# Patient Record
Sex: Male | Born: 1935 | Race: White | Hispanic: No | Marital: Married | Smoking: Former smoker
Health system: Southern US, Community
[De-identification: ages and names within clinical notes are randomized; demographics above are authoritative.]

## PROBLEM LIST (undated history)

## (undated) DIAGNOSIS — E785 Hyperlipidemia, unspecified: Secondary | ICD-10-CM

## (undated) DIAGNOSIS — H269 Unspecified cataract: Secondary | ICD-10-CM

## (undated) DIAGNOSIS — I739 Peripheral vascular disease, unspecified: Secondary | ICD-10-CM

## (undated) HISTORY — DX: Unspecified cataract: H26.9

## (undated) HISTORY — DX: Hyperlipidemia, unspecified: E78.5

## (undated) HISTORY — DX: Peripheral vascular disease, unspecified: I73.9

---

## 2007-02-02 ENCOUNTER — Ambulatory Visit: Payer: Self-pay | Admitting: Ophthalmology

## 2007-03-02 ENCOUNTER — Ambulatory Visit: Payer: Self-pay | Admitting: Ophthalmology

## 2008-02-06 ENCOUNTER — Emergency Department (HOSPITAL_COMMUNITY): Admission: EM | Admit: 2008-02-06 | Discharge: 2008-02-07 | Payer: Self-pay | Admitting: Emergency Medicine

## 2010-03-29 ENCOUNTER — Encounter: Admission: RE | Admit: 2010-03-29 | Discharge: 2010-03-29 | Payer: Self-pay | Admitting: Cardiovascular Disease

## 2010-04-26 ENCOUNTER — Ambulatory Visit (HOSPITAL_COMMUNITY): Admission: RE | Admit: 2010-04-26 | Discharge: 2010-04-26 | Payer: Self-pay | Admitting: Cardiovascular Disease

## 2010-06-11 ENCOUNTER — Encounter
Admission: RE | Admit: 2010-06-11 | Discharge: 2010-06-11 | Payer: Self-pay | Source: Home / Self Care | Admitting: Diagnostic Neuroimaging

## 2010-06-18 ENCOUNTER — Ambulatory Visit: Payer: Self-pay | Admitting: Surgery

## 2010-07-16 ENCOUNTER — Ambulatory Visit: Payer: Self-pay | Admitting: Surgery

## 2010-07-26 ENCOUNTER — Ambulatory Visit: Payer: Self-pay

## 2010-08-21 ENCOUNTER — Ambulatory Visit: Payer: Self-pay | Admitting: Oncology

## 2010-09-05 ENCOUNTER — Ambulatory Visit (HOSPITAL_COMMUNITY)
Admission: RE | Admit: 2010-09-05 | Discharge: 2010-09-05 | Payer: Self-pay | Source: Home / Self Care | Attending: Oncology | Admitting: Oncology

## 2010-09-05 LAB — CBC WITH DIFFERENTIAL/PLATELET
BASO%: 1 % (ref 0.0–2.0)
Basophils Absolute: 0 10*3/uL (ref 0.0–0.1)
EOS%: 1.9 % (ref 0.0–7.0)
Eosinophils Absolute: 0.1 10*3/uL (ref 0.0–0.5)
HCT: 45.8 % (ref 38.4–49.9)
HGB: 15.7 g/dL (ref 13.0–17.1)
LYMPH%: 31.5 % (ref 14.0–49.0)
MCH: 30.5 pg (ref 27.2–33.4)
MCHC: 34.3 g/dL (ref 32.0–36.0)
MCV: 89.1 fL (ref 79.3–98.0)
MONO#: 0.3 10*3/uL (ref 0.1–0.9)
MONO%: 6.7 % (ref 0.0–14.0)
NEUT#: 3 10*3/uL (ref 1.5–6.5)
NEUT%: 58.9 % (ref 39.0–75.0)
Platelets: 153 10*3/uL (ref 140–400)
RBC: 5.14 10*6/uL (ref 4.20–5.82)
RDW: 13.1 % (ref 11.0–14.6)
WBC: 5 10*3/uL (ref 4.0–10.3)
lymph#: 1.6 10*3/uL (ref 0.9–3.3)

## 2010-09-10 LAB — COMPREHENSIVE METABOLIC PANEL
ALT: 20 U/L (ref 0–53)
AST: 24 U/L (ref 0–37)
Albumin: 4.2 g/dL (ref 3.5–5.2)
Alkaline Phosphatase: 58 U/L (ref 39–117)
BUN: 18 mg/dL (ref 6–23)
CO2: 26 mEq/L (ref 19–32)
Calcium: 9.4 mg/dL (ref 8.4–10.5)
Chloride: 105 mEq/L (ref 96–112)
Creatinine, Ser: 1.05 mg/dL (ref 0.40–1.50)
Glucose, Bld: 115 mg/dL — ABNORMAL HIGH (ref 70–99)
Potassium: 4 mEq/L (ref 3.5–5.3)
Sodium: 141 mEq/L (ref 135–145)
Total Bilirubin: 0.7 mg/dL (ref 0.3–1.2)
Total Protein: 6.5 g/dL (ref 6.0–8.3)

## 2010-09-10 LAB — IMMUNOFIXATION ELECTROPHORESIS: IgG (Immunoglobin G), Serum: 959 mg/dL (ref 694–1618)

## 2010-09-10 LAB — KAPPA/LAMBDA LIGHT CHAINS
Kappa free light chain: 1.02 mg/dL (ref 0.33–1.94)
Kappa:Lambda Ratio: 0.74 (ref 0.26–1.65)
Lambda Free Lght Chn: 1.38 mg/dL (ref 0.57–2.63)

## 2010-10-09 ENCOUNTER — Encounter (HOSPITAL_BASED_OUTPATIENT_CLINIC_OR_DEPARTMENT_OTHER): Payer: Medicare Other | Admitting: Oncology

## 2010-10-09 DIAGNOSIS — D472 Monoclonal gammopathy: Secondary | ICD-10-CM

## 2010-12-25 ENCOUNTER — Ambulatory Visit: Payer: Self-pay

## 2011-01-01 NOTE — Assessment & Plan Note (Signed)
OFFICE VISIT   CONSTANTINE, RUDDICK  DOB:  Jun 27, 1933                                       06/18/2010  ZOXWR#:[id number]   REFERRING PHYSICIAN:  Dr. Nanetta Batty   REASON FOR VISIT:  Left leg claudication.   HISTORY:  This is a 77 year old gentleman seen at request of Dr. Allyson Sabal  for evaluation and surgical management of left leg claudication.  For  the past several years, the patient has had problems with his left leg.  He states his leg begins to ache at approximately 100 feet which causes  him to have to rest which makes his pain and symptoms go away.  This  does affect his ability to get out and do his daily activities.  He  denies ulceration or rest pain.  He also complains of pain in his right  chest and has undergone a full cardiac workup which found no evidence of  myocardial ischemia.  He has also a CT scan of his chest which was  negative.  However, his right chest pain seems to be his biggest  complaint and keeps him from sleeping at night.  He has to roll a pillow  up under his arm and take an Aleve to get rest.   The patient has undergone Doppler examination which shows an ABI of 0.66  on the left.  Angiogram confirmed an occluded left iliac.  He is here to  discuss surgical revascularization.   The patient has a history of tobacco abuse.  However, quit in October  2007.  He is also medically managed for hypercholesterolemia.  He denies  cardiac disease or diabetes or hypertension.   REVIEW OF SYSTEMS:  GENERAL:  Negative for fevers, chills, weight gain,  weight loss.  VASCULAR:  Positive for pain in his legs after approximately 5 minutes.  CARDIAC:  Positive.  He has right-sided chest pain and shortness of  breath with exertion.  HEMATOLOGY:  He bruises easily.  HEENT:  He has had cataract removal.  All other review of systems are negative as documented in the encounter  form.   PAST MEDICAL HISTORY:  Hypercholesterolemia, cataracts, and  tobacco  abuse.   PAST SURGICAL HISTORY:  None.   FAMILY HISTORY:  Positive for coronary artery disease in his mother who  died at age 47 of congestive failure.  His sister has had a pacemaker  placed for an arrhythmia.  His brother has had coronary stents placed.   SOCIAL HISTORY:  He is married with 2 children.  He is retired.  Does  not drink or smoke currently.  Quit smoking in 2007.   MEDICATIONS:  1. Pravastatin 40 mg per day.  2. Aspirin 81 mg per day.   ALLERGIES:  None.   EXAMINATION:  Vital signs:  Heart rate 70, blood pressure 120/71,  respiratory rate 12.  General:  Well-appearing, no distress.  HEENT:  Within normal limits.  Lungs:  Clear bilaterally.  No wheezes or  rhonchi.  Cardiovascular:  Regular rate.  No carotid bruits.  He has  palpable right femoral pulse left femoral pulse is not palpable.  Abdomen is soft, nontender.  He has a small umbilical hernia.  No  pulsatile mass.  Musculoskeletal:  No major deformity.  I do not  appreciate any musculoskeletal abnormalities in the right pectoral and  axillary region  where he complains of pain.  Neuro:  He has no focal  deficits.  Skin:  Without rash.   DIAGNOSTIC STUDIES:  The patient comes with ABIs from Dr. Hazle Coca  office.  I have reviewed these.  He has an ABI of 1.0 on the right and  0.66 on the left.  I have also reviewed his arteriogram which reveals an  occluded long segment left iliac occlusion with femoral reconstitution  on the left.  No appreciable right-sided iliac disease.   ASSESSMENT/PLAN:  Left leg claudication.   PLAN:  I had an extensive discussion with family, approximately 45  minutes.  We discussed surgical options including aortobifemoral bypass  as well as a right-to-left femoral-femoral bypass graft at this time.  We also discussed that this problem is not a limb-threatening situation  for him.  This would be an elective operation to improve his symptoms of  achiness in his left leg.   At this point, he is unable to convince me  that his left leg symptoms are lifestyle-limiting.  His biggest problem  has been his pain in his right chest.  The patient was unable to make a  decision as to whether or not to proceed with an operation at this time.  After a long discussion with his family, we have decided to start him on  cilostazol to see if this has any improvement in his symptoms.  I will  see him back in a month to see how he is doing and at that time we will  make decisions whether or not to proceed with surgical  revascularization.  Please copy this note to Dr. Nanetta Batty and Dr.  Loma Sender.     Jorge Ny, MD  Electronically Signed   VWB/MEDQ  D:  06/18/2010  T:  06/19/2010  Job:  [id number]

## 2011-01-01 NOTE — Assessment & Plan Note (Signed)
OFFICE VISIT   Brizzi, Keena L  DOB:  October 31, 1932                                       07/16/2010  ZOXWR#:[id number]   REASON FOR VISIT:  Followup claudication.   HISTORY:  This is a 77 year old gentleman who I initially saw at the  request of Dr. Allyson Sabal for opinions regarding surgical management of the  patient's left leg claudication.  The patient begins to have achy  symptoms at approximately 100 feet in his left leg which cause him to  rest, which alleviates his symptoms.  This is becoming lifestyle  altering for him.  He denies ulceration or rest pain.  He has an  arteriogram which shows an occluded left iliac system as well as an  irregularity in his infrarenal abdominal aorta.  When I last saw him his  biggest complaint was that of right chest pain.  A cardiac workup for  this was negative.  Since that was his biggest complaint at that time I  elected to treat him medically with cilostazol.  He comes back in today  for followup.  He states he has had minimal if no relief from his  cilostazol.  He is complaining of some balance irregularity.  He is  seeing a neurologist for this with concerns over inner ear problems.   PHYSICAL EXAMINATION:  Vital signs:  Heart rate 81, blood pressure  139/71, O2 sat 99.  General:  He is well-appearing in no distress.  Respiratory:  Nonlabored.  Extremities:  Are cool bilaterally.  There  are no ulcerations.  There is no edema.  There are no rashes.   ASSESSMENT AND PLAN:  I spent approximately 40 minutes re-reviewing the  patient's arteriogram as well as frank discussion with the patient and  his wife.  At this point we have come to the conclusion that there are  other more pressing issues for him than his left leg vascular  insufficiency.  Specifically he wants to further address the neuropathy  he is having in the legs as well as continue evaluating his right-sided  chest pain which has not been relieved yet.  We  discussed the need for  further evaluation should he develop progressive claudication or  nonhealing wounds.  He is going to contact me for his next appointment  when he is ready to undergo surgical revascularization.     Jorge Ny, MD  Electronically Signed   VWB/MEDQ  D:  07/16/2010  T:  07/16/2010  Job:  [id number]   cc:   Nanetta Batty, M.D.  Loma Sender

## 2011-01-04 NOTE — Procedures (Signed)
NAMEGIOVAN, PINSKY NO.:  [id number]   MEDICAL RECORD NO.:  [id number]          PATIENT TYPE:  AMB   LOCATION:  SDS                          FACILITY:  [facility]   PHYSICIAN:  Nanetta Batty, M.D.   DATE OF BIRTH:  [birth year]   DATE OF PROCEDURE:  DATE OF DISCHARGE:  04/26/2010                    PERIPHERAL VASCULAR INVASIVE PROCEDURE   Mr. Kolbe is a 77 year old married Caucasian male.  Patient of Dr.  Erin Hearing with history of dyslipidemia, tobacco abuse, and  claudication.  He did have chest pain, but declined stress testing.  He  had Dopplers that included left iliac with a left ABI of 0.66.  He  presents now for angiography and potential percutaneous  revascularization for left common and iliac claudication.   PROCEDURE DESCRIPTION:  The patient was brought to the second floor  [facility] angiographic suite in the postabsorptive state.  He has  been premedicated with p.o. Valium.  His right groin was prepped and  shaved in the usual sterile fashion.  Xylocaine 1%  was used for local  anesthesia.  A 5-French sheath was inserted into the right femoral  artery using standard Seldinger technique.  A 5-French pigtail catheter  was used for abdominal aortography with bifemoral runoff using bolus  chase digital subtraction step-table technique.  Visipaque dye was used  for the entirety of the case.  Retrograde aortic pressures were  monitored throughout the case.   ANGIOGRAPHIC RESULTS:  1. Abdominal aorta.      a.     Renal arteries - normal.      b.     Infrarenal abdominal aorta; 40% eccentric distal abdominal       aortic narrowing.  2. Left lower extremity;      a.     Left common and external iliac artery with reconstitution of       the common femoral just above the femoral head.      b.     Three-vessel runoff.  3. Right lower extremity;      a.     Normal arterial anatomy with minor irregularities in the SFA       and two-vessel runoff.   IMPRESSION:  Mr. Hayashida has an occluded left common and external iliac  artery.  I believe this would be difficult to percutaneously  revascularize and I suspect that the best option would be elective fem-  fem crossover grafting.  He will need Persantine Myoview to risk  stratify him prior to the surgery.   Sheath was removed and pressure was held in the groin to achieve  hemostasis.  The patient left the lab in stable condition.  He will be  hydrated, remain recumbent for 5 hours and discharged home.   I will see him back in 2-3 weeks for followup.      Nanetta Batty, M.D.     Cordelia Pen  D:  04/26/2010  T:  04/27/2010  Job:  [id number]   cc:   Thurmon Fair, MD  [facility].  Second Floor [facility] Angiographic   Electronically Signed  by Nanetta Batty M.D. on 05/07/2010 04:31:13 PM

## 2011-01-11 ENCOUNTER — Encounter (HOSPITAL_COMMUNITY)
Admission: RE | Admit: 2011-01-11 | Discharge: 2011-01-11 | Disposition: A | Discharge status: Home / Self Care | Payer: Medicare Other | Source: Ambulatory Visit | Attending: Surgery | Admitting: Surgery

## 2011-01-11 ENCOUNTER — Other Ambulatory Visit (INDEPENDENT_AMBULATORY_CARE_PROVIDER_SITE_OTHER): Payer: Self-pay | Admitting: Surgery

## 2011-01-11 ENCOUNTER — Ambulatory Visit (HOSPITAL_COMMUNITY)
Admission: RE | Admit: 2011-01-11 | Discharge: 2011-01-11 | Disposition: A | Discharge status: Home / Self Care | Payer: Medicare Other | Source: Ambulatory Visit | Attending: Surgery | Admitting: Surgery

## 2011-01-11 DIAGNOSIS — K4090 Unilateral inguinal hernia, without obstruction or gangrene, not specified as recurrent: Secondary | ICD-10-CM

## 2011-01-11 DIAGNOSIS — Z01818 Encounter for other preprocedural examination: Secondary | ICD-10-CM | POA: Insufficient documentation

## 2011-01-11 DIAGNOSIS — Z01812 Encounter for preprocedural laboratory examination: Secondary | ICD-10-CM | POA: Insufficient documentation

## 2011-01-11 DIAGNOSIS — Z01810 Encounter for preprocedural cardiovascular examination: Secondary | ICD-10-CM | POA: Insufficient documentation

## 2011-01-11 LAB — COMPREHENSIVE METABOLIC PANEL
ALT: 25 U/L (ref 0–53)
AST: 27 U/L (ref 0–37)
Albumin: 3.4 g/dL — ABNORMAL LOW (ref 3.5–5.2)
Alkaline Phosphatase: 61 U/L (ref 39–117)
BUN: 10 mg/dL (ref 6–23)
Chloride: 109 mEq/L (ref 96–112)
GFR calc Af Amer: >60 mL/min (ref >60)
Potassium: 4 mEq/L (ref 3.5–5.1)
Sodium: 143 mEq/L (ref 135–145)
Total Bilirubin: 0.4 mg/dL (ref 0.3–1.2)

## 2011-01-11 LAB — DIFFERENTIAL
Eosinophils Absolute: 0.2 10*3/uL (ref 0.0–0.7)
Lymphs Abs: 1.6 10*3/uL (ref 0.7–4.0)
Neutrophils Relative %: 54 % (ref 43–77)

## 2011-01-11 LAB — CBC
MCV: 88.6 fL (ref 78.0–100.0)
Platelets: 117 10*3/uL — ABNORMAL LOW (ref 150–400)
RBC: 4.99 MIL/uL (ref 4.22–5.81)
WBC: 5.1 10*3/uL (ref 4.0–10.5)

## 2011-01-15 ENCOUNTER — Ambulatory Visit (HOSPITAL_COMMUNITY): Admission: RE | Admit: 2011-01-15 | Payer: Medicare Other | Source: Ambulatory Visit | Admitting: Surgery

## 2011-01-18 HISTORY — PX: INGUINAL HERNIA REPAIR: SUR1180

## 2011-02-04 ENCOUNTER — Ambulatory Visit (INDEPENDENT_AMBULATORY_CARE_PROVIDER_SITE_OTHER): Payer: Medicare Other | Admitting: Surgery

## 2011-02-04 DIAGNOSIS — I70219 Atherosclerosis of native arteries of extremities with intermittent claudication, unspecified extremity: Secondary | ICD-10-CM

## 2011-02-04 NOTE — Assessment & Plan Note (Signed)
OFFICE VISIT  SHAMIR, TUZZOLINO DOB:  04/30/1933                                       02/04/2011 UEAVW#:[id number]  The patient comes back in today for further discussions regarding his left leg claudication.  We have been trying to manage him medically, as his symptoms do not appear to be that lifestyle altering.  However, he has failed trying cilostazol and other medications and is really limited now by his leg pain.  He wants to discuss further options.  He had angiogram by Dr. Allyson Sabal.  Left iliac occlusion was encountered.  There was no significant right iliac stenosis.  He had an irregular appearing aorta without aneurysmal change or stenosis.  Hypercholesterolemia, tobacco abuse and cataracts.  REVIEW OF SYSTEMS:  Negative except for what is in the HPI.  He does complain of some generalized weakness.  SOCIAL HISTORY:  He is married with 2 children.  Retired.  Does not drink or smoke currently.  Quit smoking in 2007.  FAMILY HISTORY:  Positive for coronary artery disease in his mother, died at age 49, congestive heart failure.  Sister had pacemaker for arrhythmia.  His brother had coronary stents placed.  MEDICATIONS:  Include pravastatin and aspirin.  ALLERGIES:  None.  PHYSICAL EXAMINATION:  Vital signs:  Heart rate 70, blood pressure 112/67, O2 sat 97, respiratory rate 20.  General:  He is well-appearing, in no distress.  HEENT:  Within normal limits.  Lungs:  Clear bilaterally.  Cardiovascular:  Regular rhythm, no murmur.  No carotid bruits.  Palpable right femoral pulse.  No left femoral pulse.  Abdomen: Soft and nontender.  He has a right inguinal hernia.  Musculoskeletal: No major deformities.  Neurologic:  Intact.  Skin:  Without rash.  ASSESSMENT:  Left leg claudication.  PLAN:  We have discussed proceeding with aortobifemoral versus femoral- femoral bypass graft.  We discussed the pros and cons of each.  The patient wishes to proceed  with the lesser invasive of the two which would be a right to left femoral-femoral bypass graft.  He understands that the longevity of this bypass is not as durable but he prefers the less invasive nature of it.  He also has a right inguinal hernia.  He is seeing Dr. Marca Ancona for this.  He would like to try to get these done during the same anesthetic if possible.  I have described the details of the procedure as well as the risks and benefits including but not limited to cardiopulmonary complications, infection and graft failure. He understands this and wishes to proceed as soon as possible.  I will try to contact Dr. Rosezena Sensor office to get this scheduled.  I proposed 3 dates, Friday June 20, Thursday or Friday July 12 or July 13.    Jorge Ny, MD Electronically Signed  VWB/MEDQ  D:  02/04/2011  T:  02/04/2011  Job:  [id number]  cc:   Thomas A. Cornett, M.D. Nanetta Batty, M.D. Loma Sender, MD

## 2011-02-12 ENCOUNTER — Other Ambulatory Visit: Payer: Self-pay | Admitting: Diagnostic Neuroimaging

## 2011-02-12 DIAGNOSIS — R269 Unspecified abnormalities of gait and mobility: Secondary | ICD-10-CM

## 2011-02-12 DIAGNOSIS — G629 Polyneuropathy, unspecified: Secondary | ICD-10-CM

## 2011-02-13 ENCOUNTER — Other Ambulatory Visit (HOSPITAL_COMMUNITY)
Admission: RE | Admit: 2011-02-13 | Discharge: 2011-02-13 | Disposition: A | Discharge status: Home / Self Care | Payer: Medicare Other | Source: Ambulatory Visit | Attending: Radiology | Admitting: Radiology

## 2011-02-13 ENCOUNTER — Ambulatory Visit
Admission: RE | Admit: 2011-02-13 | Discharge: 2011-02-13 | Disposition: A | Discharge status: Home / Self Care | Payer: Medicare Other | Source: Ambulatory Visit | Attending: Diagnostic Neuroimaging | Admitting: Diagnostic Neuroimaging

## 2011-02-13 ENCOUNTER — Other Ambulatory Visit: Payer: Self-pay | Admitting: Radiology

## 2011-02-13 DIAGNOSIS — G629 Polyneuropathy, unspecified: Secondary | ICD-10-CM

## 2011-02-13 DIAGNOSIS — G589 Mononeuropathy, unspecified: Secondary | ICD-10-CM | POA: Insufficient documentation

## 2011-02-13 DIAGNOSIS — R269 Unspecified abnormalities of gait and mobility: Secondary | ICD-10-CM

## 2011-02-14 ENCOUNTER — Ambulatory Visit (HOSPITAL_COMMUNITY)
Admission: RE | Admit: 2011-02-14 | Discharge: 2011-02-14 | Disposition: A | Discharge status: Home / Self Care | Payer: Medicare Other | Source: Ambulatory Visit | Attending: Surgery | Admitting: Surgery

## 2011-02-14 ENCOUNTER — Encounter (HOSPITAL_COMMUNITY)
Admission: RE | Admit: 2011-02-14 | Discharge: 2011-02-14 | Disposition: A | Discharge status: Home / Self Care | Payer: Medicare Other | Source: Ambulatory Visit | Attending: Neurosurgery | Admitting: Neurosurgery

## 2011-02-14 ENCOUNTER — Other Ambulatory Visit: Payer: Self-pay | Admitting: Surgery

## 2011-02-14 DIAGNOSIS — Z01818 Encounter for other preprocedural examination: Secondary | ICD-10-CM | POA: Insufficient documentation

## 2011-02-14 DIAGNOSIS — Z01812 Encounter for preprocedural laboratory examination: Secondary | ICD-10-CM | POA: Insufficient documentation

## 2011-02-14 DIAGNOSIS — Z01811 Encounter for preprocedural respiratory examination: Secondary | ICD-10-CM

## 2011-02-14 LAB — CBC
HCT: 45 % (ref 39.0–52.0)
MCH: 30.6 pg (ref 26.0–34.0)
MCHC: 35.1 g/dL (ref 30.0–36.0)
MCV: 87.2 fL (ref 78.0–100.0)
RDW: 13.3 % (ref 11.5–15.5)

## 2011-02-14 LAB — COMPREHENSIVE METABOLIC PANEL
AST: 19 U/L (ref 0–37)
Albumin: 3.4 g/dL — ABNORMAL LOW (ref 3.5–5.2)
Alkaline Phosphatase: 56 U/L (ref 39–117)
Chloride: 107 mEq/L (ref 96–112)
Creatinine, Ser: 1.09 mg/dL (ref 0.50–1.35)
Potassium: 4.3 mEq/L (ref 3.5–5.1)
Total Bilirubin: 0.4 mg/dL (ref 0.3–1.2)
Total Protein: 6.2 g/dL (ref 6.0–8.3)

## 2011-02-14 LAB — TYPE AND SCREEN
ABO/RH(D): A POS
Antibody Screen: NEGATIVE

## 2011-02-14 LAB — URINALYSIS, ROUTINE W REFLEX MICROSCOPIC
Bilirubin Urine: NEGATIVE
Hgb urine dipstick: NEGATIVE
Specific Gravity, Urine: 1.026 (ref 1.005–1.030)
pH: 5.5 (ref 5.0–8.0)

## 2011-02-14 LAB — SURGICAL PCR SCREEN: MRSA, PCR: NEGATIVE

## 2011-02-15 ENCOUNTER — Inpatient Hospital Stay (HOSPITAL_COMMUNITY)
Admission: RE | Admit: 2011-02-15 | Discharge: 2011-02-19 | DRG: 253 | Disposition: A | Discharge status: Home / Self Care | Payer: Medicare Other | Source: Ambulatory Visit | Attending: Surgery | Admitting: Surgery

## 2011-02-15 ENCOUNTER — Ambulatory Visit (HOSPITAL_COMMUNITY): Admission: RE | Admit: 2011-02-15 | Payer: Medicare Other | Source: Ambulatory Visit | Admitting: Surgery

## 2011-02-15 DIAGNOSIS — G589 Mononeuropathy, unspecified: Secondary | ICD-10-CM | POA: Diagnosis present

## 2011-02-15 DIAGNOSIS — F17200 Nicotine dependence, unspecified, uncomplicated: Secondary | ICD-10-CM | POA: Diagnosis present

## 2011-02-15 DIAGNOSIS — N5089 Other specified disorders of the male genital organs: Secondary | ICD-10-CM | POA: Diagnosis not present

## 2011-02-15 DIAGNOSIS — Z01812 Encounter for preprocedural laboratory examination: Secondary | ICD-10-CM

## 2011-02-15 DIAGNOSIS — J9819 Other pulmonary collapse: Secondary | ICD-10-CM | POA: Diagnosis not present

## 2011-02-15 DIAGNOSIS — E7800 Pure hypercholesterolemia, unspecified: Secondary | ICD-10-CM | POA: Diagnosis present

## 2011-02-15 DIAGNOSIS — H269 Unspecified cataract: Secondary | ICD-10-CM | POA: Diagnosis present

## 2011-02-15 DIAGNOSIS — I7092 Chronic total occlusion of artery of the extremities: Secondary | ICD-10-CM | POA: Diagnosis present

## 2011-02-15 DIAGNOSIS — I70219 Atherosclerosis of native arteries of extremities with intermittent claudication, unspecified extremity: Principal | ICD-10-CM | POA: Diagnosis present

## 2011-02-15 DIAGNOSIS — D62 Acute posthemorrhagic anemia: Secondary | ICD-10-CM | POA: Diagnosis not present

## 2011-02-15 DIAGNOSIS — Z7982 Long term (current) use of aspirin: Secondary | ICD-10-CM

## 2011-02-15 DIAGNOSIS — Z01818 Encounter for other preprocedural examination: Secondary | ICD-10-CM

## 2011-02-15 DIAGNOSIS — K4090 Unilateral inguinal hernia, without obstruction or gangrene, not specified as recurrent: Secondary | ICD-10-CM

## 2011-02-15 HISTORY — PX: OTHER SURGICAL HISTORY: SHX169

## 2011-02-16 ENCOUNTER — Inpatient Hospital Stay (HOSPITAL_COMMUNITY): Payer: Medicare Other

## 2011-02-16 LAB — CBC
Hemoglobin: 13.4 g/dL (ref 13.0–17.0)
MCH: 30.1 pg (ref 26.0–34.0)
Platelets: 111 10*3/uL — ABNORMAL LOW (ref 150–400)
RBC: 4.45 MIL/uL (ref 4.22–5.81)
WBC: 10.2 10*3/uL (ref 4.0–10.5)

## 2011-02-16 LAB — BASIC METABOLIC PANEL
CO2: 27 mEq/L (ref 19–32)
Calcium: 8.2 mg/dL — ABNORMAL LOW (ref 8.4–10.5)
Chloride: 102 mEq/L (ref 96–112)
Glucose, Bld: 144 mg/dL — ABNORMAL HIGH (ref 70–99)
Potassium: 4.3 mEq/L (ref 3.5–5.1)
Sodium: 135 mEq/L (ref 135–145)

## 2011-02-16 LAB — GLUCOSE, CAPILLARY: Glucose-Capillary: 144 mg/dL — ABNORMAL HIGH (ref 70–99)

## 2011-02-17 LAB — GLUCOSE, CAPILLARY: Glucose-Capillary: 122 mg/dL — ABNORMAL HIGH (ref 70–99)

## 2011-02-17 LAB — BASIC METABOLIC PANEL
BUN: 12 mg/dL (ref 6–23)
CO2: 27 mEq/L (ref 19–32)
Calcium: 8.1 mg/dL — ABNORMAL LOW (ref 8.4–10.5)
GFR calc non Af Amer: >60 mL/min (ref >60)
Glucose, Bld: 128 mg/dL — ABNORMAL HIGH (ref 70–99)
Potassium: 4.1 mEq/L (ref 3.5–5.1)

## 2011-02-17 LAB — CBC
HCT: 39.4 % (ref 39.0–52.0)
Hemoglobin: 13.7 g/dL (ref 13.0–17.0)
MCHC: 34.8 g/dL (ref 30.0–36.0)
MCV: 88.5 fL (ref 78.0–100.0)
RDW: 13.2 % (ref 11.5–15.5)

## 2011-02-18 DIAGNOSIS — I70219 Atherosclerosis of native arteries of extremities with intermittent claudication, unspecified extremity: Secondary | ICD-10-CM

## 2011-02-18 LAB — URINE CULTURE

## 2011-02-18 LAB — CBC
MCH: 30.2 pg (ref 26.0–34.0)
MCV: 88.5 fL (ref 78.0–100.0)
Platelets: 100 10*3/uL — ABNORMAL LOW (ref 150–400)
RBC: 4.27 MIL/uL (ref 4.22–5.81)
RDW: 13.3 % (ref 11.5–15.5)

## 2011-02-23 LAB — CULTURE, BLOOD (ROUTINE X 2)
Culture  Setup Time: 201207011146
Culture: NO GROWTH
Culture: NO GROWTH

## 2011-02-23 NOTE — Op Note (Signed)
George, Garrison NO.:  [id number]  MEDICAL RECORD NO.:  [id number]  LOCATION:  2899                         FACILITY:  [facility]  PHYSICIAN:  Maisie Fus A. Kataleah Bejar, M.D.DATE OF BIRTH:  05/18/1933  DATE OF PROCEDURE:  02/15/2011 DATE OF DISCHARGE:                              OPERATIVE REPORT   PREOPERATIVE DIAGNOSIS:  Direct right inguinal hernia.  POSTOPERATIVE DIAGNOSIS:  Direct right inguinal hernia.  PROCEDURE:  Repair of right inguinal hernia with Ultrapro mesh.  SURGEON:  Maisie Fus A. Sophea Rackham, MD  ANESTHESIA:  General endotracheal anesthesia.  ESTIMATED BLOOD LOSS:  Minimal.  SPECIMENS:  None.  INDICATIONS FOR PROCEDURE:  The patient is a 77 year old male who has bilateral inguinal hernia but only his right inguinal hernia is symptomatic.  He also has aortoiliac occlusive disease and is in need of a femoral-femoral bypass to improve blood flow to his lower extremities. He was seen by Dr. Durene Cal and he wished to have both his inguinal hernia and vascular procedures done at the same time.  He was counseled about the potential risk of increased infection of either his graft or his mesh since these areas were fairly close to each other and in the groin regions.  The risk of hernia repair include bleeding, infection, chronic pain, recurrence, injury to the bladder, major blood vessels, bowel, nerves, and chronic pain in the region and on the inner thigh. Also potential complications of infection, seroma formation, graft failure, graft infection as well as mesh infection and failure, potential for orchitis and inflammation of the testicles and spermatic cord were discussed.  He understood all the above potential risks and agreed to proceed.  We agreed to leave the left inguinal hernia alone for now, especially in light of him requiring a vascular procedure.  DESCRIPTION OF PROCEDURE:  The patient was brought to the operating room after being seen in  the holding area.  His right groin was initialed. After induction of general anesthesia, the lower abdomen and lower extremities were prepped and draped in sterile fashion.  Time-out was done.  A right inguinal incision was made.  Dissection was carried down through Scarpa fascia.  The superficial epigastric vessels were ligated with 3-0 Vicryl.  The aponeurosis of the external oblique was identified and opened in the direction of its fibers.  The spermatic cord was identified.  There was a large direct defect, I dissected it easily off the spermatic cord.  Once this was done, I identified the ilioinguinal nerve and divided it as it exited the muscle.  Also a branch of the iliohypogastric nerve was divided since this was going to come into play where the mesh was going to be sutured down to.  An Ultrapro mesh was cut into a keyhole configuration and then it was used to repair the floor of the inguinal canal.  A #1 Novafil suture was used to secure down the pubic tubercle and Cooper's ligament inferiorly, the internal oblique medially, and the shelving edge of the inguinal ligament laterally.  Slit was cut for the cord structures to exit through it.  We had ample room for the cord structures to exit and the  tip of my fifth digit could fit easily into this area.  Hemostasis was excellent.  I then at this point in time closed the aponeurosis of the external oblique with 2-0 Vicryl.  The 3-0 Vicryl was used to approximate Scarpa fascia and 4-0 Monocryl was used to close the skin in subcuticular fashion.  Dermabond was applied.  At this portion of the case, Dr. Durene Cal was going to do his femoral-femoral bypass and he took over at this point.  The patient at this point of the case was stable.  All final counts were found to be correct in this portion of the case.     George Garrison, M.D.     TAC/MEDQ  D:  02/15/2011  T:  02/15/2011  Job:  [id number]  Electronically Signed by  Harriette Bouillon M.D. on 02/23/2011 05:14:21 PM

## 2011-03-03 NOTE — Discharge Summary (Signed)
George Garrison, POSS NO.:  [id number]  MEDICAL RECORD NO.:  [id number]  LOCATION:  2028                         FACILITY:  [facility]  PHYSICIAN:  Juleen China IV, MDDATE OF BIRTH:  Jul 11, 1933  DATE OF ADMISSION:  02/15/2011 DATE OF DISCHARGE:  02/19/2011                              DISCHARGE SUMMARY   ADMISSION DIAGNOSIS:  Left lower extremity claudication.  HISTORY OF PRESENT ILLNESS:  This is a 77 year old male who initially saw Dr. Myra Gianotti at the request of Dr. Allyson Sabal for opinions regarding surgical management of the patient's left leg claudication.  The patient began to have achy symptoms at approximately 100 feet in his left leg which caused him to rest which alleviates his symptoms.  This was becoming lifestyle altering for him.  He denies ulceration or rest pain. He had an arteriogram which revealed occluded left iliac system as well as an irregularity in his infrarenal abdominal aorta.  When Dr. Myra Gianotti last saw him, his biggest complaint was that of right chest pain.  A cardiac workup for this was negative.  Since that was his biggest complaint at that time, he was treated medically with cilostazol.  He came back for followup and decided to proceed with surgical management.  HOSPITAL COURSE:  The patient was admitted to the hospital and taken to the operating room where he underwent a right-to-left femoral-femoral bypass grafting as well as repair of right inguinal hernia with Ultrapro mesh by general surgeon, Dr. Luisa Hart.  The patient tolerated this well and was transported to the recovery room in satisfactory condition.  By postoperative day #1, he was doing well but had some increased pain.  He did have a palpable pulse bilaterally in the lower extremities and he was transported to the telemetry floor that day.  By postoperative day #2, he had spiked a fever of 103.8, at which time blood and urine cultures were obtained as well as a chest x-ray.   The cultures were negative and chest x-ray revealed only mild bibasilar atelectasis.  He was also requiring 2 liters of oxygen, and this was weaned successfully. The patient does have some scrotal edema.  His white count did improve and by postoperative day #4, he has been afebrile for 24 hours.  His postoperative ABIs are 1.04 on the right and 1.10 on the left, and this has improved from his preop ABIs of 0.66 on the left.  Otherwise, his hospital course included an increasing ambulation as well as increasing intake of solids without difficulty.  DISCHARGE INSTRUCTIONS:  He is discharged home with extensive instructions on wound care and progressive ambulation.  He is instructed not to drive or perform any heavy lifting until return to see Dr. Myra Gianotti as well as Dr. Luisa Hart.  DISCHARGE DIAGNOSES: 1. Left leg claudication.     a.     Status post right-to-left femoral-to-femoral bypass graft. 2. Right inguinal hernia.     a.     Status post right inguinal herniorrhaphy on February 15, 2011. 3. Hypercholesterolemia. 4. History of cataracts. 5. Tobacco abuse.  DISCHARGE MEDICATIONS: 1. Oxycodone 5 mg one p.o. q.4-6 h. p.r.n. pain #30 no refill. 2.  Aricept 5 mg one p.o. nightly. 3. Aspirin 81 mg one p.o. daily. 4. Lyrica 75 mg one p.o. b.i.d. 5. Neuropathy Support Formula OTC that contains vitamin B1 and B12 two     tablets p.o. b.i.d. 6. Pravastatin 40 mg p.o. nightly.  FOLLOWUP: 1. The patient is to follow up with Dr. Myra Gianotti in 2 weeks. 2. The patient is to follow up with Dr. Luisa Hart in 3-4 weeks.     Newton Pigg, PA   ______________________________ V. Charlena Cross, MD    SE/MEDQ  D:  02/19/2011  T:  02/19/2011  Job:  [id number]  cc:   Thomas A. Cornett, M.D.  Electronically Signed by Newton Pigg PA on 02/19/2011 10:31:03 AM Electronically Signed by Arelia Longest IV MD on 03/03/2011 11:03:27 PM

## 2011-03-03 NOTE — Op Note (Signed)
George Garrison, WILKIE NO.:  [id number]  MEDICAL RECORD NO.:  [id number]  LOCATION:  2028                         FACILITY:  [facility]  PHYSICIAN:  Juleen China IV, MDDATE OF BIRTH:  12-28-1932  DATE OF PROCEDURE:  02/15/2011 DATE OF DISCHARGE:                              OPERATIVE REPORT   PREOPERATIVE DIAGNOSIS:  Left leg ischemia.  POSTOPERATIVE DIAGNOSIS:  Left leg ischemia.  PROCEDURE PERFORMED:  Right-to-left femoral-femoral bypass graft with 8- mm Dacron.  SURGEON: 1. Charlena Cross, MD.  ASSISTANT:  Pecola Leisure, PA.  ANESTHESIA:  General.  ESTIMATED BLOOD LOSS:  See anesthesia record.  FINDINGS:  Both limbs of the graft sewn to the distal common femoral artery over top of the origin of the profunda femoral artery.  INDICATIONS:  This is a 77 year old gentleman with left leg ischemia. He has failed nonoperative treatment.  I offered him aortobifemoral versus femoral-femoral bypass graft.  He elected to go with a less invasive femoral-femoral graft.  The patient also has a right inguinal hernia.  I spoke with Dr. Marca Ancona who had evaluated him for his hernia, and we elected to proceed both procedures simultaneously.  PROCEDURE:  The patient was identified in the holding area, taken to room #9, and placed supine on the table.  General anesthesia was administered.  The patient was prepped and draped in usual fashion. Time-out was called.  Dr. Luisa Hart first performed the right inguinal hernia repair.  Please see his operative note for details of this procedure.  Once he was performed, I came in for femoral-femoral graft. Longitudinal incisions were made in each groin.  Sharp dissection was used to dissect down to the femoral sheath which was opened sharply. Both common femoral arteries were exposed and encircled with a vessel loop.  The common femoral arteries were exposed bilaterally from the inguinal ligament down to the  bifurcation.  Superficial femoral and profunda femoral arteries were also each individually isolated.  I then created a tunnel anterior to the rectus sheath between the 2 groin incisions passing just superior to the pubis.  An 8-mm Dacron graft was then brought through the tunnel.  The patient was then systemically heparinized.  After the heparin had circulated, the attention was turned towards the right groin.  The right common femoral, profunda femoral, and superficial femoral artery were occluded.  There was a soft spot on the anterior surface of the common femoral artery with mild amount of posterior plaque.  An #11 blade was used to make an arteriotomy which was extended longitudinally with Potts scissors.  The graft was cut to the appropriate and was beveled to fit the size of the arteriotomy and a running anastomosis was created with 5-0 Prolene.  Prior to completion, appropriate flush maneuvers were performed and anastomosis was completed.  There was excellent pulsatile flow to the graft.  The graft was then flushed with heparin saline and re-occluded.  Attention was then turned towards the left groin.  The common femoral, profunda femoral, and superficial femoral artery were all occluded.  There was significant calcification in the proximal common femoral artery.  This was able to be occluded  with a Henley clamp.  The profunda femoral and superficial femoral artery were also occluded.  There was a soft spot on the anterior surface of the distal common femoral artery.  I made an arteriotomy with an #11 blade and extended this longitudinally with Potts scissors.  The distal end of the arteriotomy was onto the profunda femoral artery.  The graft was then cut to the appropriate length and then beveled to fit the size of the arteriotomy.  I made sure that there was a good angle coming from the tunnel.  An end-to-side anastomosis was created with running 5-0 Prolene.  Prior to  completion, appropriate flush maneuvers were performed and the anastomosis was completed.  I then evaluated the signal in both groins.  There were multiphasic signals in each profunda femoral and superficial femoral artery.  At this point, the patient's heparin was reversed with protamine.  Once hemostasis was achieved, the femoral sheath was reapproximated with 2-0 Vicryl, the subcutaneous tissue was closed in multiple layers with 3-0 Vicryl, and the skin was closed with 4-0 Vicryl.  Dermabond was placed on the wound.  Sterile dressings were applied.  The patient tolerated the procedure well.  There were no complications.  He was successfully extubated and taken to recovery room in stable condition.     Jorge Ny, MD     VWB/MEDQ  D:  02/18/2011  T:  02/19/2011  Job:  [id number]  Electronically Signed by Arelia Longest IV MD on 03/03/2011 11:03:30 PM

## 2011-03-04 ENCOUNTER — Ambulatory Visit (INDEPENDENT_AMBULATORY_CARE_PROVIDER_SITE_OTHER): Payer: Medicare Other | Admitting: Surgery

## 2011-03-04 DIAGNOSIS — I70219 Atherosclerosis of native arteries of extremities with intermittent claudication, unspecified extremity: Secondary | ICD-10-CM

## 2011-03-05 NOTE — Assessment & Plan Note (Signed)
OFFICE VISIT  George Garrison, FUELLING DOB:  1932-12-07                                       03/04/2011 [id number]#:[id number]  The patient is back today for followup.  On 02/15/2011 he underwent right to left femoral-femoral bypass graft with 8 mm Dacron.  Dr. Luisa Hart also performed repair of right inguinal hernia at the same setting.  The patient's postoperative course was uncomplicated, however, he did spike a temperature to 103 prior to discharge.  There was no obvious source for his fever and he has been afebrile since that time. He is back today for followup.  He has no complaints.  He is walking without limitations.  PHYSICAL EXAMINATION:  On examination his incisions are all well-healed. He has palpable pedal pulses.  I will plan on seeing him back in 3 months with a duplex scan.    Jorge Ny, MD Electronically Signed  VWB/MEDQ  D:  03/04/2011  T:  03/05/2011  Job:  [id number]  cc:   Thomas A. Cornett, M.D. Nanetta Batty, M.D. Loma Sender, MD

## 2011-03-21 ENCOUNTER — Encounter (INDEPENDENT_AMBULATORY_CARE_PROVIDER_SITE_OTHER): Payer: Medicare Other

## 2011-03-21 ENCOUNTER — Ambulatory Visit (INDEPENDENT_AMBULATORY_CARE_PROVIDER_SITE_OTHER): Payer: Medicare Other

## 2011-03-21 DIAGNOSIS — Z48812 Encounter for surgical aftercare following surgery on the circulatory system: Secondary | ICD-10-CM

## 2011-03-21 DIAGNOSIS — T82898A Other specified complication of vascular prosthetic devices, implants and grafts, initial encounter: Secondary | ICD-10-CM

## 2011-03-21 DIAGNOSIS — I7092 Chronic total occlusion of artery of the extremities: Secondary | ICD-10-CM

## 2011-03-21 NOTE — Assessment & Plan Note (Signed)
OFFICE VISIT  [name], George Garrison DOB:  07/15/1933                                       03/21/2011 [id number]#:[id number]  DATE OF SURGERY:  February 15, 2011.  The patient returns today with concerns of "knots" in his groin incisions.  The patient is not having any other problems.  He just recently was feeling down by his incisions and felt a knot.  PHYSICAL EXAMINATION:  Blood pressure is 120/65 in the left arm, heart rate 82, and 98% on room air.  His groin incisions from his right to left femoral-femoral bypass on February 15, 2011 are healing nicely.  He does have a hematoma bilaterally.  He has 2+ posterior tibialis pulses that are palpable.  His feet bilaterally are warm.  He did have a right groin and duplex scan today that revealed no evidence of right groin pseudoaneurysm.  There was a hematoma measuring 2 x 2 cm at the location where the patient feels a lump.  The patient was inquiring about lifting anything heavy.  He does have a follow-up appointment with Dr. Luisa Hart in 2 weeks, and I told him no heavy lifting more than 10 pounds until returning to see Dr. Luisa Hart.  I did tell him it was okay to continue to ride his lawnmower.  He also states that he has been having some left knee pain where it will just buckle and he will fall.  I have suggested that he follow-up with his primary care physician to evaluate his left knee pain.  He does have a follow-up appointment with Dr. Myra Gianotti in 3 months, and I have advised he and his wife to keep this appointment.  Newton Pigg, PA  Charles E. Fields, MD Electronically Signed  SE/MEDQ  D:  03/21/2011  T:  03/21/2011  Job:  [id number]

## 2011-03-27 NOTE — Procedures (Unsigned)
VASCULAR LAB EXAM  INDICATION:  Rule out right groin pseudoaneurysm.  HISTORY: Diabetes:  No. Cardiac:  No. Hypertension:  No. Other:  Hyperlipidemia. Right to left femoral to femoral bypass graft 02/15/2011.  EXAM:  Right groin duplex to rule out pseudoaneurysm.  IMPRESSION: 1. No evidence of right groin pseudoaneurysm. 2. Of note; there is a hematoma measuring 2.10 x 2.02 cm at the     location where the patient feels a lump.  ___________________________________________ V. Charlena Cross, MD  EM/MEDQ  D:  03/22/2011  T:  03/22/2011  Job:  [id number]

## 2011-03-28 ENCOUNTER — Encounter (INDEPENDENT_AMBULATORY_CARE_PROVIDER_SITE_OTHER): Payer: Self-pay | Admitting: Surgery

## 2011-04-02 ENCOUNTER — Encounter (INDEPENDENT_AMBULATORY_CARE_PROVIDER_SITE_OTHER): Payer: Self-pay | Admitting: Surgery

## 2011-04-02 ENCOUNTER — Ambulatory Visit (INDEPENDENT_AMBULATORY_CARE_PROVIDER_SITE_OTHER): Payer: Medicare Other | Admitting: Surgery

## 2011-04-02 DIAGNOSIS — Z9889 Other specified postprocedural states: Secondary | ICD-10-CM

## 2011-04-02 NOTE — Patient Instructions (Signed)
No restrictions  Follow-up as needed

## 2011-04-02 NOTE — Progress Notes (Signed)
The patient returns to clinic today. He is 6 weeks out from repair of an inguinal hernia with mesh. This was done with Dr. Myra Gianotti who performed a femoral to femoral bypass at the same time. He has no complaints.  On exam: Left inguinal incision well healed. No signs of infection. No signs of recurrent hernia.  Impression: Status post repair of left inguinal hernia with mesh  Plan: He will follow up with me as needed and full activity.

## 2011-04-09 ENCOUNTER — Other Ambulatory Visit: Payer: Self-pay | Admitting: Oncology

## 2011-04-09 ENCOUNTER — Encounter (HOSPITAL_BASED_OUTPATIENT_CLINIC_OR_DEPARTMENT_OTHER): Payer: Medicare Other | Admitting: Oncology

## 2011-04-09 DIAGNOSIS — D472 Monoclonal gammopathy: Secondary | ICD-10-CM

## 2011-04-09 LAB — CBC WITH DIFFERENTIAL/PLATELET
Basophils Absolute: 0 10*3/uL (ref 0.0–0.1)
EOS%: 2.7 % (ref 0.0–7.0)
Eosinophils Absolute: 0.1 10*3/uL (ref 0.0–0.5)
HCT: 42.9 % (ref 38.4–49.9)
HGB: 14.7 g/dL (ref 13.0–17.1)
MCH: 30.3 pg (ref 27.2–33.4)
MCV: 88.3 fL (ref 79.3–98.0)
MONO%: 8 % (ref 0.0–14.0)
NEUT#: 2.9 10*3/uL (ref 1.5–6.5)
NEUT%: 54.7 % (ref 39.0–75.0)
Platelets: 134 10*3/uL — ABNORMAL LOW (ref 140–400)
RDW: 14.3 % (ref 11.0–14.6)

## 2011-04-11 LAB — SPEP & IFE WITH QIG
Albumin ELP: 58.2 % (ref 55.8–66.1)
Alpha-1-Globulin: 4.3 % (ref 2.9–4.9)
IgA: 175 mg/dL (ref 68–379)
IgM, Serum: 92 mg/dL (ref 41–251)
Total Protein, Serum Electrophoresis: 6.6 g/dL (ref 6.0–8.3)

## 2011-04-11 LAB — COMPREHENSIVE METABOLIC PANEL
Albumin: 4.2 g/dL (ref 3.5–5.2)
Alkaline Phosphatase: 52 U/L (ref 39–117)
BUN: 12 mg/dL (ref 6–23)
Calcium: 9.5 mg/dL (ref 8.4–10.5)
Creatinine, Ser: 1.02 mg/dL (ref 0.50–1.35)
Glucose, Bld: 86 mg/dL (ref 70–99)
Potassium: 4.3 mEq/L (ref 3.5–5.3)

## 2011-04-11 LAB — KAPPA/LAMBDA LIGHT CHAINS: Kappa free light chain: 1.84 mg/dL (ref 0.33–1.94)

## 2011-04-16 ENCOUNTER — Encounter (HOSPITAL_BASED_OUTPATIENT_CLINIC_OR_DEPARTMENT_OTHER): Payer: Medicare Other | Admitting: Oncology

## 2011-04-16 DIAGNOSIS — D472 Monoclonal gammopathy: Secondary | ICD-10-CM

## 2011-04-17 ENCOUNTER — Encounter: Payer: Self-pay | Admitting: Surgery

## 2011-04-17 ENCOUNTER — Encounter: Payer: Self-pay | Admitting: *Deleted

## 2011-05-10 ENCOUNTER — Ambulatory Visit: Payer: Self-pay

## 2011-05-16 LAB — URINE MICROSCOPIC-ADD ON

## 2011-05-16 LAB — URINALYSIS, ROUTINE W REFLEX MICROSCOPIC
Leukocytes, UA: NEGATIVE
Nitrite: NEGATIVE
Specific Gravity, Urine: >1.046 — ABNORMAL HIGH
Urobilinogen, UA: 1

## 2011-05-16 LAB — COMPREHENSIVE METABOLIC PANEL
AST: 39 — ABNORMAL HIGH
BUN: 15
CO2: 24
Calcium: 9.2
Chloride: 106
Creatinine, Ser: 1.35
GFR calc Af Amer: >60
GFR calc non Af Amer: 52 — ABNORMAL LOW
Glucose, Bld: 107 — ABNORMAL HIGH
Total Bilirubin: 0.9

## 2011-05-16 LAB — DIFFERENTIAL
Basophils Absolute: 0
Lymphocytes Relative: 29
Lymphs Abs: 0.9
Neutro Abs: 2
Neutrophils Relative %: 66

## 2011-05-16 LAB — CBC
HCT: 49.3
MCHC: 35.3
MCV: 90.2
RBC: 5.47
WBC: 3 — ABNORMAL LOW

## 2011-05-16 LAB — URINE CULTURE: Colony Count: 3000

## 2011-05-16 LAB — LIPASE, BLOOD: Lipase: 22

## 2011-05-23 ENCOUNTER — Ambulatory Visit: Payer: Self-pay

## 2011-05-24 ENCOUNTER — Encounter: Payer: Self-pay | Admitting: Surgery

## 2011-05-27 ENCOUNTER — Ambulatory Visit (INDEPENDENT_AMBULATORY_CARE_PROVIDER_SITE_OTHER): Payer: Medicare Other | Admitting: Surgery

## 2011-05-27 ENCOUNTER — Encounter: Payer: Self-pay | Admitting: Surgery

## 2011-05-27 ENCOUNTER — Encounter (INDEPENDENT_AMBULATORY_CARE_PROVIDER_SITE_OTHER): Payer: Medicare Other | Admitting: *Deleted

## 2011-05-27 VITALS — BP 110/65 | HR 57 | Resp 20 | Ht 67.5 in | Wt 170.0 lb

## 2011-05-27 DIAGNOSIS — I739 Peripheral vascular disease, unspecified: Secondary | ICD-10-CM

## 2011-05-27 DIAGNOSIS — Z48812 Encounter for surgical aftercare following surgery on the circulatory system: Secondary | ICD-10-CM

## 2011-05-27 DIAGNOSIS — I70219 Atherosclerosis of native arteries of extremities with intermittent claudication, unspecified extremity: Secondary | ICD-10-CM | POA: Insufficient documentation

## 2011-05-27 NOTE — Progress Notes (Signed)
[facility] [place]   Patient name: George Garrison MRN: [id number] DOB: 1933-01-23 Sex: male     Chief Complaint  Patient presents with  . Follow-up    3 mo. f/u/s/p right -left fem-fem BP 02/15/11    HISTORY OF PRESENT ILLNESS: The patient comes back today for followup. He is status post right to left femoral-femoral bypass graft with 8 mm dacryon graft on 02/15/2011. Simultaneously he also underwent repair of a right inguinal hernia in the same setting. His postoperative course was uncomplicated except for a fever spike to 103 prior to discharge. There is no source of or etiology of his fever he has not had a shoe that sense. He was seen in our office by my PA with complaints of knots in his groin incisions an ultrasound was performed which showed a a 2 x 2 centimeter hematoma in the right groin where the patient felt a lump. There was no evidence of pseudoaneurysm. The patient is back today for continued followup. He states that his claudication type symptoms have improved however he still had issues with neuropathy. He is taking Lyrica. He is also complaining of some swelling in the left leg for which a 23 mm compression great stocking was given. He is wearing this intermittently.  Past Medical History  Diagnosis Date  . Peripheral vascular disease   . Hyperlipidemia   . Cataracts, bilateral     Past Surgical History  Procedure Date  . Femoral to femoral bypass graft 02/15/11  . Inguinal hernia repair 01/2011    right    History   Social History  . Marital Status: Married    Spouse Name: N/A    Number of Children: N/A  . Years of Education: N/A   Occupational History  . Not on file.   Social History Main Topics  . Smoking status: Former Smoker    Quit date: 03/07/2006  . Smokeless tobacco: Not on file  . Alcohol Use: No  . Drug Use: No  . Sexually Active: Not on file   Other Topics Concern  . Not on file   Social History Narrative  . No narrative  on file    Family History  Problem Relation Age of Onset  . Heart disease Mother   . Heart disease Sister   . Heart disease Brother     Allergies as of 05/27/2011  . (No Known Allergies)    Current Outpatient Prescriptions on File Prior to Visit  Medication Sig Dispense Refill  . aspirin 81 MG tablet Take 81 mg by mouth daily.        . Donepezil HCl (ARICEPT PO) Take by mouth daily.        . pravastatin (PRAVACHOL) 40 MG tablet Take 40 mg by mouth daily.           REVIEW OF SYSTEMS: No changes from prior visit per the encounter form PHYSICAL EXAMINATION: General: The patient appears their stated age.  Vital signs are BP 110/65  Pulse 57  Resp 20  Ht 5' 7.5" (1.715 m)  Wt 170 lb (77.111 kg)  BMI 26.23 kg/m2 Pulmonary: There is a good air exchange bilaterally  Abdomen: Soft and non-tender  Musculoskeletal: There are no major deformities.  There is no significant extremity pain. Neurologic: No focal weakness or paresthesias are detected, Skin: There are no ulcer or rashes noted. Psychiatric: The patient has normal affect. Cardiovascular:  Femoral femoral bypass graft pulse is easily appreciated in the lower  abdomen pedal pulses are not palpable   Diagnostic Studies none  Assessment: Status post right to left femoral-femoral bypass graft Plan: First surgical perspective the patient is doing as expected. He no longer has incisional pain. His incisions are well-healed and soft. There is a palpable pulse within his graft. Unfortunately he still having significant problems with his neuropathy. He is going to talk to his primary care physician regarding increasing his Lyrica. The patient is going to get continued to vascular followups with Dr. Allyson Sabal who keep me updated with his progress  V. Charlena Cross, M.D. [facility] [place] Office: [phone]

## 2011-05-30 ENCOUNTER — Ambulatory Visit: Payer: Self-pay

## 2011-06-03 NOTE — Procedures (Unsigned)
VASCULAR LAB EXAM  INDICATION:  Follow up right-to-left femoral-femoral bypass graft and hematoma.  HISTORY: Diabetes:  No. Cardiac:  No. Hypertension:  No.  EXAM:  Follow up right-to-left femoral-femoral bypass graft and hematoma that was noted in previous examinations on 03/21/2011.  IMPRESSION: 1. Patent right-to-left femoral-to-femoral bypass graft with no     evidence of stenosis.  Please see the following worksheet for     velocity measurements. 2. The previous right groin hematoma is no longer visualized.  ___________________________________________ V. Charlena Cross, MD  EM/MEDQ  D:  05/27/2011  T:  05/27/2011  Job:  [id number]

## 2011-06-17 ENCOUNTER — Ambulatory Visit: Payer: Self-pay | Admitting: General Surgery

## 2011-06-17 ENCOUNTER — Ambulatory Visit: Payer: Self-pay | Admitting: Cardiothoracic Surgery

## 2011-06-18 ENCOUNTER — Ambulatory Visit: Payer: Self-pay | Admitting: Cardiothoracic Surgery

## 2011-06-20 ENCOUNTER — Ambulatory Visit: Payer: Self-pay | Admitting: Cardiothoracic Surgery

## 2011-06-27 ENCOUNTER — Ambulatory Visit: Payer: Self-pay | Admitting: General Surgery

## 2011-07-03 ENCOUNTER — Inpatient Hospital Stay: Payer: Self-pay | Admitting: General Surgery

## 2011-07-10 LAB — PATHOLOGY REPORT

## 2011-07-20 ENCOUNTER — Ambulatory Visit: Payer: Self-pay | Admitting: Cardiothoracic Surgery

## 2011-07-24 ENCOUNTER — Ambulatory Visit: Payer: Self-pay | Admitting: Gastroenterology

## 2011-07-26 LAB — PATHOLOGY REPORT

## 2011-08-08 LAB — PSA: PSA: 1.7 ng/mL (ref 0.0–4.0)

## 2011-08-20 ENCOUNTER — Ambulatory Visit: Payer: Self-pay | Admitting: Cardiothoracic Surgery

## 2011-08-26 LAB — CBC CANCER CENTER
Basophil #: 0 x10 3/mm (ref 0.0–0.1)
HGB: 14.9 g/dL (ref 13.0–18.0)
Lymphocyte %: 28 %
MCH: 30.1 pg (ref 26.0–34.0)
Monocyte %: 7.3 %
Platelet: 163 x10 3/mm (ref 150–440)
RDW: 14.1 % (ref 11.5–14.5)
WBC: 4.5 x10 3/mm (ref 3.8–10.6)

## 2011-08-26 LAB — COMPREHENSIVE METABOLIC PANEL
Alkaline Phosphatase: 83 U/L (ref 50–136)
Anion Gap: 6 — ABNORMAL LOW (ref 7–16)
BUN: 11 mg/dL (ref 7–18)
Bilirubin,Total: 0.5 mg/dL (ref 0.2–1.0)
Calcium, Total: 9.3 mg/dL (ref 8.5–10.1)
Chloride: 107 mmol/L (ref 98–107)
Co2: 30 mmol/L (ref 21–32)
Creatinine: 1.13 mg/dL (ref 0.60–1.30)
EGFR (African American): >60
EGFR (Non-African Amer.): >60
Glucose: 94 mg/dL (ref 65–99)
Osmolality: 284 (ref 275–301)
Sodium: 143 mmol/L (ref 136–145)
Total Protein: 7.2 g/dL (ref 6.4–8.2)

## 2011-09-02 LAB — COMPREHENSIVE METABOLIC PANEL
Albumin: 3.5 g/dL (ref 3.4–5.0)
Anion Gap: 8 (ref 7–16)
Bilirubin,Total: 0.5 mg/dL (ref 0.2–1.0)
Calcium, Total: 9 mg/dL (ref 8.5–10.1)
Chloride: 103 mmol/L (ref 98–107)
Co2: 29 mmol/L (ref 21–32)
Creatinine: 1.18 mg/dL (ref 0.60–1.30)
Osmolality: 281 (ref 275–301)
Potassium: 4 mmol/L (ref 3.5–5.1)
Sodium: 140 mmol/L (ref 136–145)
Total Protein: 7 g/dL (ref 6.4–8.2)

## 2011-09-02 LAB — CBC CANCER CENTER
Basophil #: 0 x10 3/mm (ref 0.0–0.1)
Eosinophil %: 2.8 %
HCT: 43.8 % (ref 40.0–52.0)
HGB: 15 g/dL (ref 13.0–18.0)
Lymphocyte #: 1.2 x10 3/mm (ref 1.0–3.6)
Lymphocyte %: 27.8 %
MCV: 88 fL (ref 80–100)
Monocyte %: 4 %
Neutrophil #: 2.8 x10 3/mm (ref 1.4–6.5)
RBC: 4.98 10*6/uL (ref 4.40–5.90)
RDW: 14 % (ref 11.5–14.5)
WBC: 4.3 x10 3/mm (ref 3.8–10.6)

## 2011-09-09 LAB — CBC CANCER CENTER
Basophil #: 0 x10 3/mm (ref 0.0–0.1)
Basophil %: 0.7 %
Eosinophil #: 0.1 x10 3/mm (ref 0.0–0.7)
HGB: 15 g/dL (ref 13.0–18.0)
Lymphocyte %: 36.3 %
MCH: 30.3 pg (ref 26.0–34.0)
MCHC: 34.9 g/dL (ref 32.0–36.0)
Monocyte #: 0.1 x10 3/mm (ref 0.0–0.7)
Monocyte %: 6.4 %
Neutrophil %: 52.8 %
Platelet: 134 x10 3/mm — ABNORMAL LOW (ref 150–440)
RDW: 14.5 % (ref 11.5–14.5)

## 2011-09-09 LAB — HEPATIC FUNCTION PANEL A (ARMC)
Albumin: 3.4 g/dL (ref 3.4–5.0)
Bilirubin,Total: 0.5 mg/dL (ref 0.2–1.0)
SGOT(AST): 26 U/L (ref 15–37)
SGPT (ALT): 37 U/L
Total Protein: 6.7 g/dL (ref 6.4–8.2)

## 2011-09-09 LAB — MAGNESIUM: Magnesium: 1.8 mg/dL

## 2011-09-09 LAB — POTASSIUM: Potassium: 3.5 mmol/L (ref 3.5–5.1)

## 2011-09-16 LAB — CBC CANCER CENTER
Eosinophil #: 0.1 x10 3/mm (ref 0.0–0.7)
Eosinophil %: 3.7 %
HCT: 42.8 % (ref 40.0–52.0)
Lymphocyte #: 0.8 x10 3/mm — ABNORMAL LOW (ref 1.0–3.6)
Lymphocyte %: 29.8 %
MCH: 30.2 pg (ref 26.0–34.0)
Monocyte %: 6.8 %
Neutrophil %: 58.2 %
Platelet: 124 x10 3/mm — ABNORMAL LOW (ref 150–440)
RBC: 4.9 10*6/uL (ref 4.40–5.90)
RDW: 14.2 % (ref 11.5–14.5)
WBC: 2.8 x10 3/mm — ABNORMAL LOW (ref 3.8–10.6)

## 2011-09-16 LAB — COMPREHENSIVE METABOLIC PANEL
Alkaline Phosphatase: 79 U/L (ref 50–136)
Anion Gap: 10 (ref 7–16)
Calcium, Total: 8.7 mg/dL (ref 8.5–10.1)
Co2: 28 mmol/L (ref 21–32)
EGFR (Non-African Amer.): >60
Osmolality: 286 (ref 275–301)
Potassium: 3.6 mmol/L (ref 3.5–5.1)
SGOT(AST): 22 U/L (ref 15–37)
Sodium: 143 mmol/L (ref 136–145)

## 2011-09-20 ENCOUNTER — Ambulatory Visit: Payer: Self-pay | Admitting: Cardiothoracic Surgery

## 2011-09-23 LAB — CBC CANCER CENTER
Basophil #: 0 x10 3/mm (ref 0.0–0.1)
HCT: 41.4 % (ref 40.0–52.0)
HGB: 14.6 g/dL (ref 13.0–18.0)
Lymphocyte #: 0.7 x10 3/mm — ABNORMAL LOW (ref 1.0–3.6)
Lymphocyte %: 27.9 %
MCH: 30.7 pg (ref 26.0–34.0)
MCHC: 35.2 g/dL (ref 32.0–36.0)
MCV: 87 fL (ref 80–100)
Monocyte #: 0.2 x10 3/mm (ref 0.0–0.7)
Monocyte %: 8.2 %
Neutrophil #: 1.5 x10 3/mm (ref 1.4–6.5)
RDW: 14.9 % — ABNORMAL HIGH (ref 11.5–14.5)
WBC: 2.5 x10 3/mm — ABNORMAL LOW (ref 3.8–10.6)

## 2011-09-23 LAB — COMPREHENSIVE METABOLIC PANEL
Albumin: 3.3 g/dL — ABNORMAL LOW (ref 3.4–5.0)
Alkaline Phosphatase: 85 U/L (ref 50–136)
Anion Gap: 9 (ref 7–16)
BUN: 15 mg/dL (ref 7–18)
Chloride: 104 mmol/L (ref 98–107)
Co2: 28 mmol/L (ref 21–32)
Creatinine: 1.08 mg/dL (ref 0.60–1.30)
EGFR (African American): >60
EGFR (Non-African Amer.): >60
Glucose: 113 mg/dL — ABNORMAL HIGH (ref 65–99)
Osmolality: 283 (ref 275–301)
SGPT (ALT): 39 U/L
Sodium: 141 mmol/L (ref 136–145)

## 2011-09-28 ENCOUNTER — Telehealth: Payer: Self-pay | Admitting: Oncology

## 2011-09-28 NOTE — Telephone Encounter (Signed)
S/w the pt and he is aware of his aug 2013 appts °

## 2011-09-30 LAB — CBC CANCER CENTER
Basophil #: 0 x10 3/mm (ref 0.0–0.1)
Eosinophil #: 0 x10 3/mm (ref 0.0–0.7)
Eosinophil %: 2 %
HCT: 39 % — ABNORMAL LOW (ref 40.0–52.0)
Lymphocyte #: 0.6 x10 3/mm — ABNORMAL LOW (ref 1.0–3.6)
MCH: 30.6 pg (ref 26.0–34.0)
MCV: 87 fL (ref 80–100)
Monocyte #: 0.2 x10 3/mm (ref 0.0–0.7)
Monocyte %: 6.9 %
Neutrophil #: 1.4 x10 3/mm (ref 1.4–6.5)
Neutrophil %: 62.6 %
RBC: 4.5 10*6/uL (ref 4.40–5.90)
RDW: 15.1 % — ABNORMAL HIGH (ref 11.5–14.5)
WBC: 2.2 x10 3/mm — ABNORMAL LOW (ref 3.8–10.6)

## 2011-09-30 LAB — COMPREHENSIVE METABOLIC PANEL
Alkaline Phosphatase: 76 U/L (ref 50–136)
Bilirubin,Total: 0.6 mg/dL (ref 0.2–1.0)
Chloride: 102 mmol/L (ref 98–107)
Co2: 29 mmol/L (ref 21–32)
Creatinine: 1.11 mg/dL (ref 0.60–1.30)
EGFR (Non-African Amer.): >60
Potassium: 3.7 mmol/L (ref 3.5–5.1)
SGOT(AST): 29 U/L (ref 15–37)
SGPT (ALT): 39 U/L
Sodium: 140 mmol/L (ref 136–145)
Total Protein: 6.6 g/dL (ref 6.4–8.2)

## 2011-10-07 LAB — CBC CANCER CENTER
Basophil %: 1.2 %
Eosinophil #: 0 x10 3/mm (ref 0.0–0.7)
HCT: 38.7 % — ABNORMAL LOW (ref 40.0–52.0)
Lymphocyte %: 27.4 %
MCHC: 34.8 g/dL (ref 32.0–36.0)
Monocyte #: 0.2 x10 3/mm (ref 0.0–0.7)
Neutrophil #: 1.3 x10 3/mm — ABNORMAL LOW (ref 1.4–6.5)
Platelet: 111 x10 3/mm — ABNORMAL LOW (ref 150–440)
RDW: 15.9 % — ABNORMAL HIGH (ref 11.5–14.5)
WBC: 2.2 x10 3/mm — ABNORMAL LOW (ref 3.8–10.6)

## 2011-10-07 LAB — COMPREHENSIVE METABOLIC PANEL
Alkaline Phosphatase: 81 U/L (ref 50–136)
Anion Gap: 8 (ref 7–16)
Bilirubin,Total: 0.5 mg/dL (ref 0.2–1.0)
Calcium, Total: 8.8 mg/dL (ref 8.5–10.1)
Chloride: 105 mmol/L (ref 98–107)
Co2: 25 mmol/L (ref 21–32)
Creatinine: 1.09 mg/dL (ref 0.60–1.30)
EGFR (African American): >60
EGFR (Non-African Amer.): >60
Osmolality: 277 (ref 275–301)
Potassium: 3.9 mmol/L (ref 3.5–5.1)
Sodium: 138 mmol/L (ref 136–145)
Total Protein: 6.7 g/dL (ref 6.4–8.2)

## 2011-10-18 ENCOUNTER — Ambulatory Visit: Payer: Self-pay | Admitting: Cardiothoracic Surgery

## 2011-11-25 ENCOUNTER — Ambulatory Visit: Payer: Self-pay | Admitting: Oncology

## 2011-12-12 ENCOUNTER — Ambulatory Visit: Payer: Self-pay | Admitting: Oncology

## 2011-12-16 LAB — COMPREHENSIVE METABOLIC PANEL
Albumin: 3.4 g/dL (ref 3.4–5.0)
Bilirubin,Total: 0.6 mg/dL (ref 0.2–1.0)
Calcium, Total: 9.1 mg/dL (ref 8.5–10.1)
Chloride: 106 mmol/L (ref 98–107)
Co2: 27 mmol/L (ref 21–32)
EGFR (African American): >60
EGFR (Non-African Amer.): >60
Glucose: 106 mg/dL — ABNORMAL HIGH (ref 65–99)
Osmolality: 280 (ref 275–301)
Sodium: 141 mmol/L (ref 136–145)
Total Protein: 7.5 g/dL (ref 6.4–8.2)

## 2011-12-16 LAB — CBC CANCER CENTER
Eosinophil #: 0.1 x10 3/mm (ref 0.0–0.7)
Lymphocyte #: 0.8 x10 3/mm — ABNORMAL LOW (ref 1.0–3.6)
Lymphocyte %: 21.8 %
MCHC: 33.2 g/dL (ref 32.0–36.0)
MCV: 94 fL (ref 80–100)
Monocyte %: 6.2 %
Neutrophil #: 2.3 x10 3/mm (ref 1.4–6.5)
RBC: 4.55 10*6/uL (ref 4.40–5.90)
RDW: 13.9 % (ref 11.5–14.5)

## 2011-12-18 ENCOUNTER — Ambulatory Visit: Payer: Self-pay | Admitting: Oncology

## 2012-01-27 ENCOUNTER — Ambulatory Visit: Payer: Self-pay | Admitting: Oncology

## 2012-02-11 ENCOUNTER — Ambulatory Visit: Payer: Self-pay | Admitting: Oncology

## 2012-02-17 ENCOUNTER — Ambulatory Visit: Payer: Self-pay | Admitting: Oncology

## 2012-02-19 LAB — COMPREHENSIVE METABOLIC PANEL
Albumin: 3.4 g/dL (ref 3.4–5.0)
Alkaline Phosphatase: 67 U/L (ref 50–136)
Bilirubin,Total: 0.7 mg/dL (ref 0.2–1.0)
Chloride: 107 mmol/L (ref 98–107)
Co2: 27 mmol/L (ref 21–32)
Osmolality: 283 (ref 275–301)
Potassium: 4.1 mmol/L (ref 3.5–5.1)
Sodium: 142 mmol/L (ref 136–145)
Total Protein: 6.7 g/dL (ref 6.4–8.2)

## 2012-02-19 LAB — CBC CANCER CENTER
Basophil #: 0.1 x10 3/mm (ref 0.0–0.1)
Eosinophil #: 0.1 x10 3/mm (ref 0.0–0.7)
HCT: 44.4 % (ref 40.0–52.0)
HGB: 14.6 g/dL (ref 13.0–18.0)
Lymphocyte #: 0.9 x10 3/mm — ABNORMAL LOW (ref 1.0–3.6)
MCH: 29.4 pg (ref 26.0–34.0)
MCHC: 33 g/dL (ref 32.0–36.0)
MCV: 89 fL (ref 80–100)
Monocyte #: 0.3 x10 3/mm (ref 0.2–1.0)
Monocyte %: 9.1 %
Neutrophil #: 2.2 x10 3/mm (ref 1.4–6.5)
Platelet: 114 x10 3/mm — ABNORMAL LOW (ref 150–440)
RDW: 14.4 % (ref 11.5–14.5)

## 2012-03-19 ENCOUNTER — Ambulatory Visit: Payer: Self-pay | Admitting: Oncology

## 2012-04-03 ENCOUNTER — Telehealth: Payer: Self-pay | Admitting: Oncology

## 2012-04-03 NOTE — Telephone Encounter (Signed)
Moved 8/28 appt to 10/1. S/w wife she is aware. Pt to keep lb 8/21.

## 2012-04-08 ENCOUNTER — Other Ambulatory Visit: Payer: Medicare Other | Admitting: Lab

## 2012-04-15 ENCOUNTER — Ambulatory Visit: Payer: Medicare Other | Admitting: Oncology

## 2012-04-19 ENCOUNTER — Ambulatory Visit: Payer: Self-pay | Admitting: Oncology

## 2012-05-19 ENCOUNTER — Ambulatory Visit: Payer: Medicare Other | Admitting: Oncology

## 2012-05-25 ENCOUNTER — Other Ambulatory Visit: Payer: Self-pay | Admitting: Cardiovascular Disease

## 2012-05-25 ENCOUNTER — Ambulatory Visit: Payer: Self-pay | Admitting: Oncology

## 2012-05-25 LAB — COMPREHENSIVE METABOLIC PANEL
Anion Gap: 8 (ref 7–16)
Calcium, Total: 9.2 mg/dL (ref 8.5–10.1)
Chloride: 106 mmol/L (ref 98–107)
Co2: 28 mmol/L (ref 21–32)
Creatinine: 1.03 mg/dL (ref 0.60–1.30)
EGFR (African American): >60
EGFR (Non-African Amer.): >60
Osmolality: 282 (ref 275–301)
Potassium: 4.4 mmol/L (ref 3.5–5.1)
SGOT(AST): 26 U/L (ref 15–37)
SGPT (ALT): 34 U/L (ref 12–78)

## 2012-05-25 LAB — CBC CANCER CENTER
Basophil #: 0 x10 3/mm (ref 0.0–0.1)
Eosinophil #: 0.1 x10 3/mm (ref 0.0–0.7)
HCT: 46.3 % (ref 40.0–52.0)
Lymphocyte %: 24.8 %
MCH: 30.4 pg (ref 26.0–34.0)
MCHC: 33 g/dL (ref 32.0–36.0)
Monocyte %: 7.2 %
Neutrophil #: 3 x10 3/mm (ref 1.4–6.5)
Neutrophil %: 65 %
Platelet: 132 x10 3/mm — ABNORMAL LOW (ref 150–440)

## 2012-05-25 LAB — LIPID PANEL
Ldl Cholesterol, Calc: 108 mg/dL — ABNORMAL HIGH (ref 0–100)
Triglycerides: 110 mg/dL (ref 0–200)

## 2012-05-25 LAB — HEPATIC FUNCTION PANEL A (ARMC)
Albumin: 3.7 g/dL (ref 3.4–5.0)
Bilirubin, Direct: 0.1 mg/dL (ref 0.00–0.20)
SGOT(AST): 24 U/L (ref 15–37)

## 2012-05-26 LAB — CANCER ANTIGEN 27.29: CA 27.29: 17 U/mL (ref 0.0–38.6)

## 2012-05-27 ENCOUNTER — Ambulatory Visit: Payer: Self-pay | Admitting: Oncology

## 2012-06-19 ENCOUNTER — Ambulatory Visit: Payer: Self-pay | Admitting: Oncology

## 2012-08-19 ENCOUNTER — Ambulatory Visit: Payer: Self-pay | Admitting: Oncology

## 2012-08-31 ENCOUNTER — Ambulatory Visit: Payer: Self-pay | Admitting: Oncology

## 2012-08-31 LAB — COMPREHENSIVE METABOLIC PANEL
Anion Gap: 8 (ref 7–16)
BUN: 13 mg/dL (ref 7–18)
Calcium, Total: 9 mg/dL (ref 8.5–10.1)
Chloride: 104 mmol/L (ref 98–107)
Creatinine: 1.04 mg/dL (ref 0.60–1.30)
Glucose: 98 mg/dL (ref 65–99)
SGOT(AST): 20 U/L (ref 15–37)
Sodium: 142 mmol/L (ref 136–145)
Total Protein: 7.1 g/dL (ref 6.4–8.2)

## 2012-08-31 LAB — CBC CANCER CENTER
Basophil #: 0.1 x10 3/mm (ref 0.0–0.1)
Basophil %: 1.3 %
Eosinophil #: 0.1 x10 3/mm (ref 0.0–0.7)
Eosinophil %: 2.1 %
HCT: 48.9 % (ref 40.0–52.0)
HGB: 16.8 g/dL (ref 13.0–18.0)
Lymphocyte %: 25.7 %
MCH: 31 pg (ref 26.0–34.0)
MCHC: 34.5 g/dL (ref 32.0–36.0)
MCV: 90 fL (ref 80–100)
Monocyte #: 0.5 x10 3/mm (ref 0.2–1.0)
Neutrophil %: 62.3 %
RBC: 5.44 10*6/uL (ref 4.40–5.90)
RDW: 13.7 % (ref 11.5–14.5)
WBC: 5.3 x10 3/mm (ref 3.8–10.6)

## 2012-09-01 LAB — CANCER ANTIGEN 27.29: CA 27.29: 21 U/mL (ref 0.0–38.6)

## 2012-09-19 ENCOUNTER — Ambulatory Visit: Payer: Self-pay | Admitting: Oncology

## 2012-11-17 ENCOUNTER — Ambulatory Visit: Payer: Self-pay | Admitting: Oncology

## 2012-11-20 IMAGING — US ABDOMEN ULTRASOUND LIMITED
1 series · 17 of 25 positions shown · non-contrast
Comparison: none

REASON FOR EXAM: ATTN GALLBLADDER  nausea diarrhea off and on vomiting
Rt lower chest pn X 6mos
COMMENTS:

[Series 1: abdomen ultrasound limited · 17 of 113 slices shown]
[im 1/113]
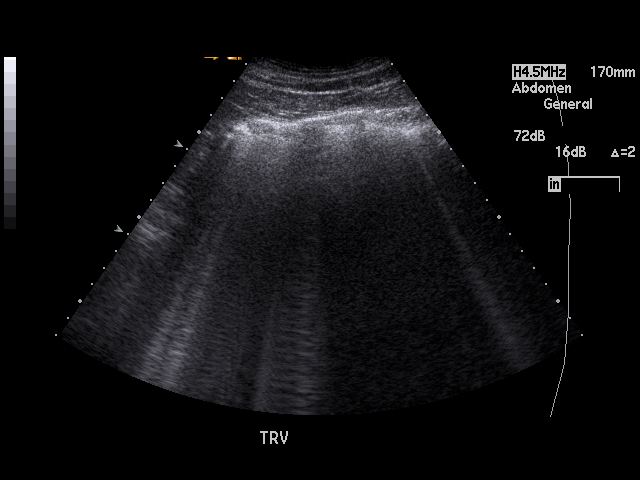
[im 10/113]
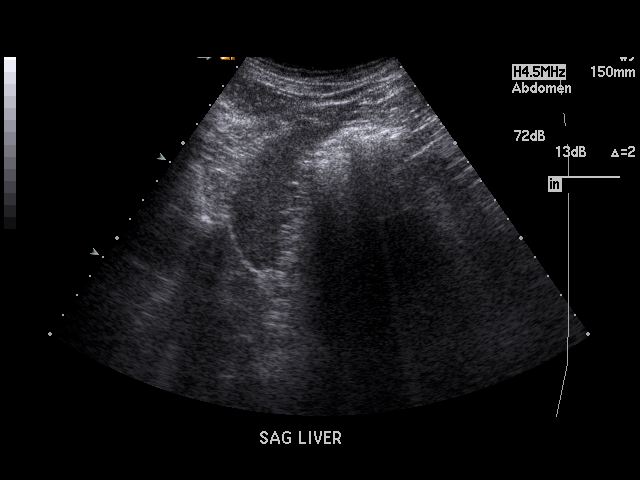
[im 15/113]
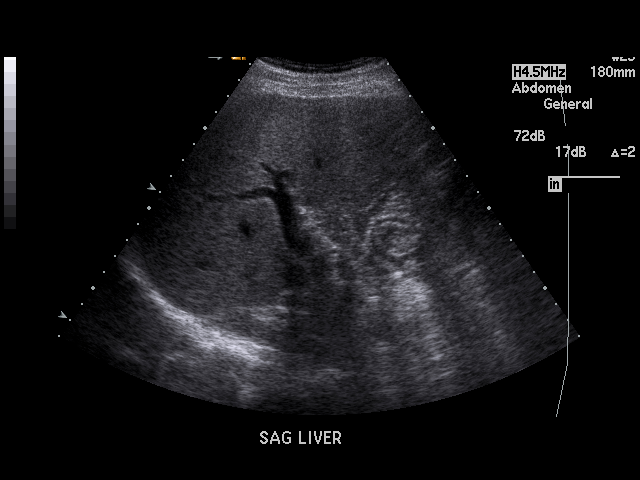
[im 24/113]
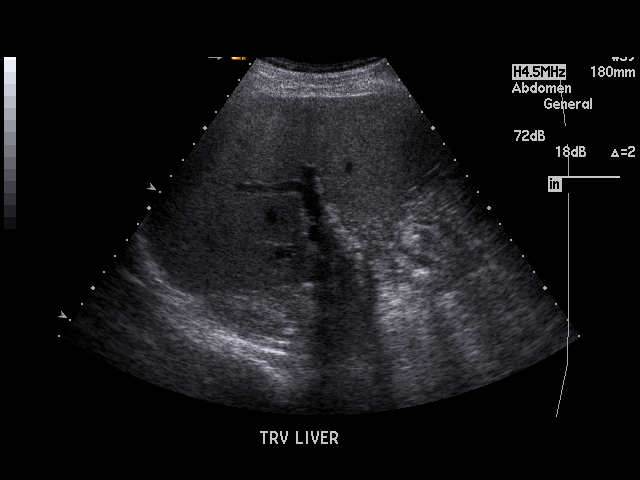
[im 29/113]
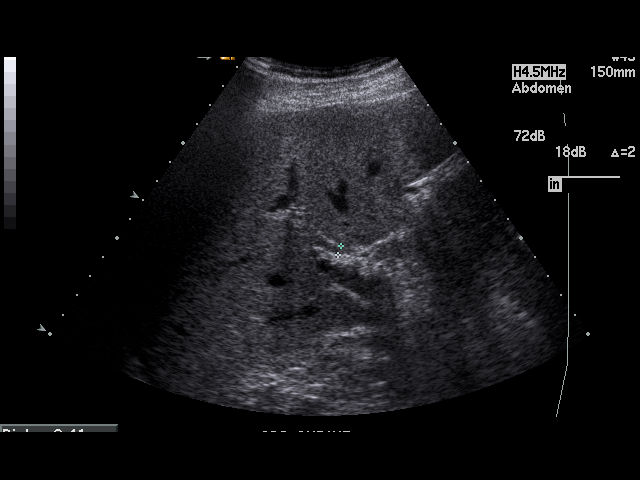
[im 38/113]
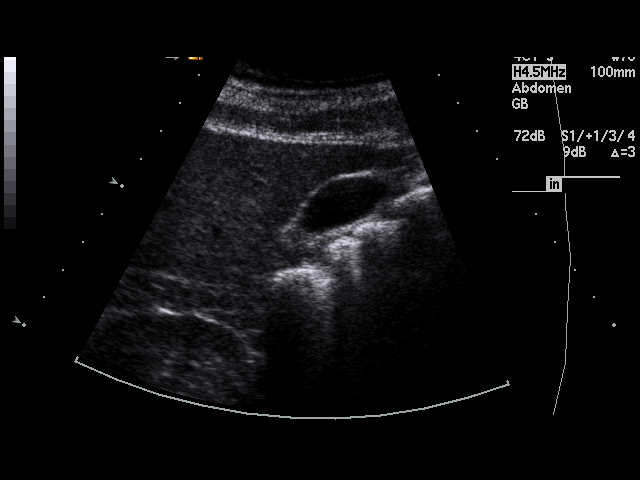
[im 43/113]
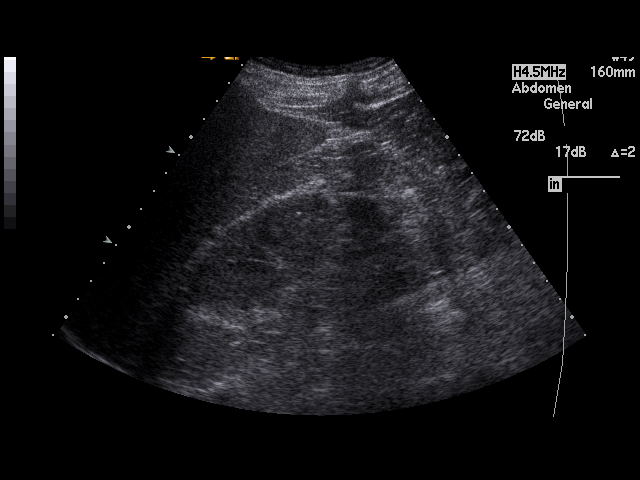
[im 52/113]
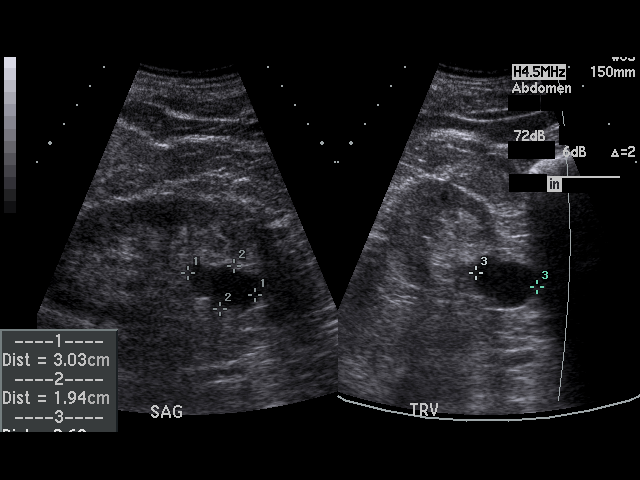
[im 57/113]
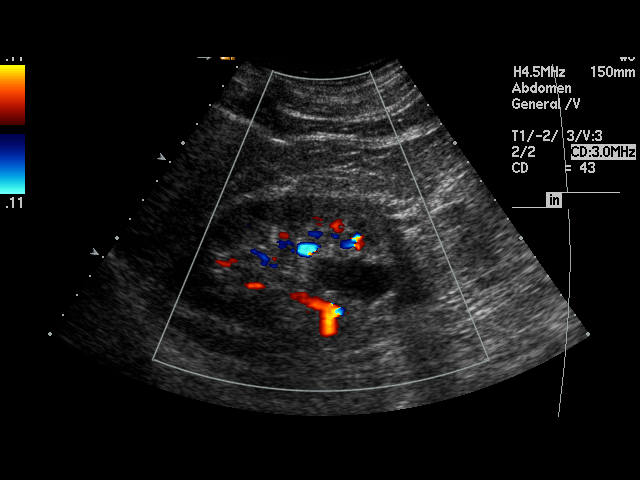
[im 61/113]
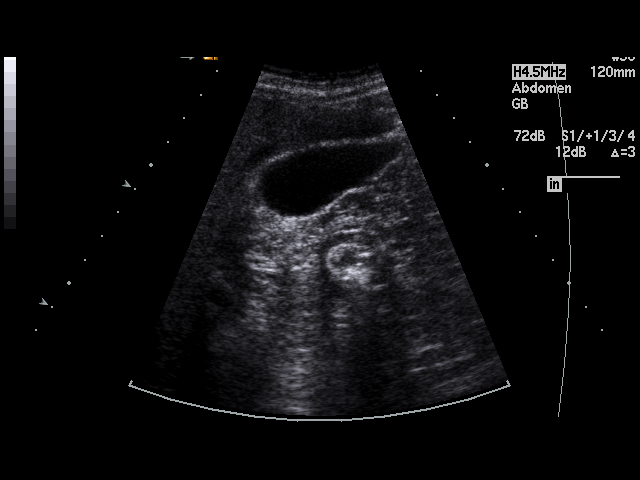
[im 71/113]
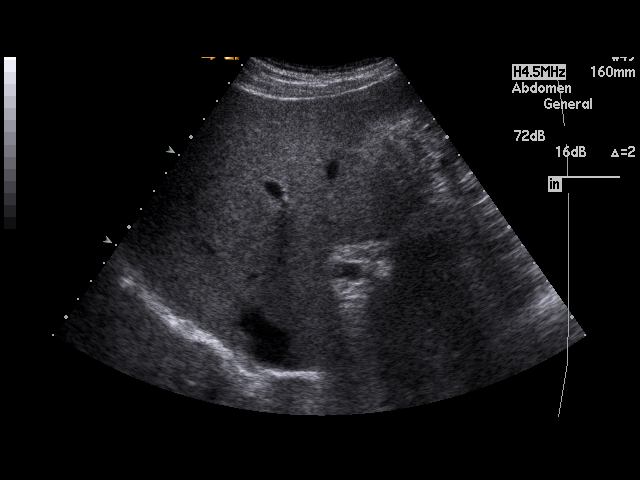
[im 75/113]
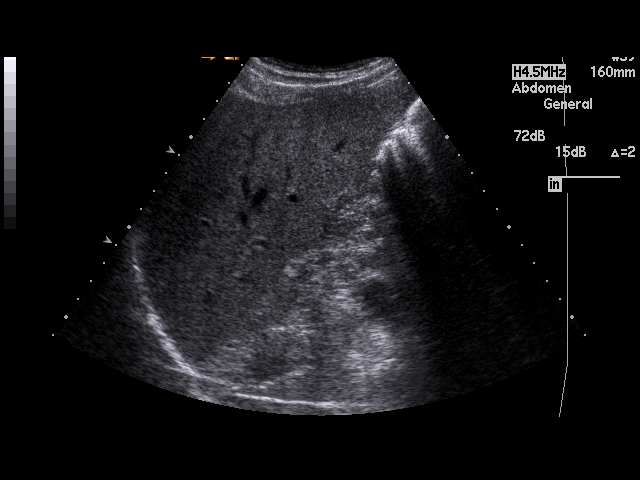
[im 85/113]
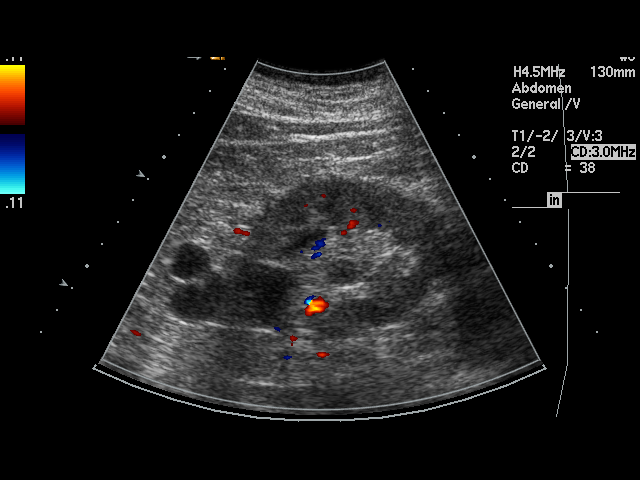
[im 89/113]
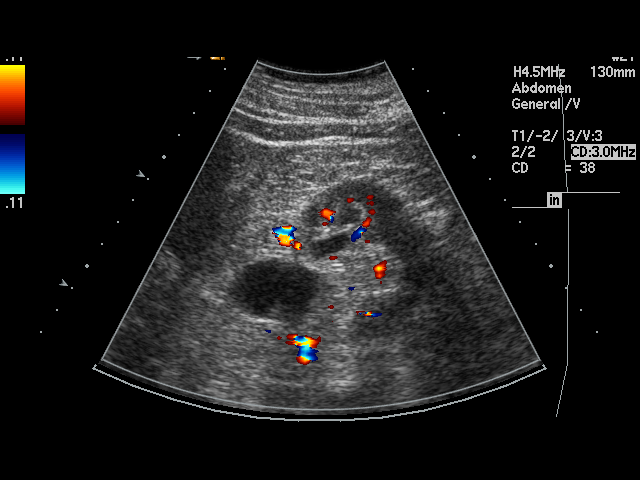
[im 99/113]
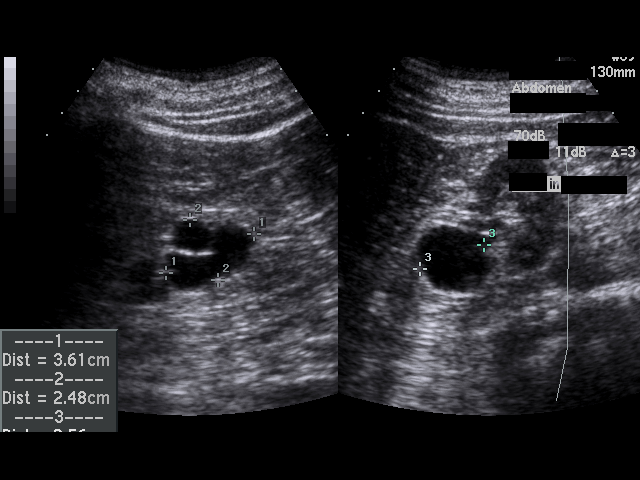
[im 103/113]
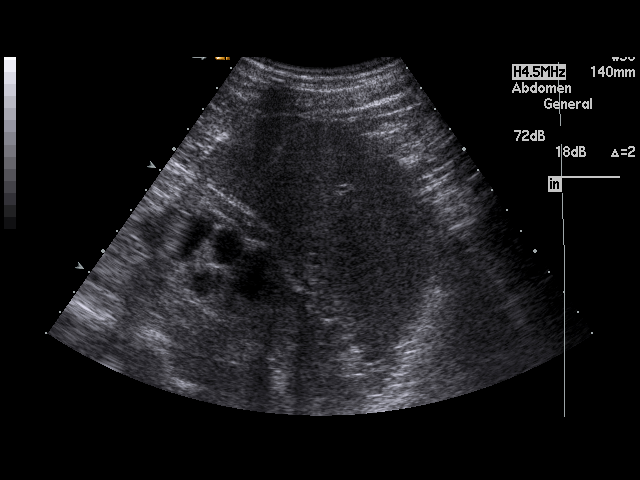
[im 113/113]
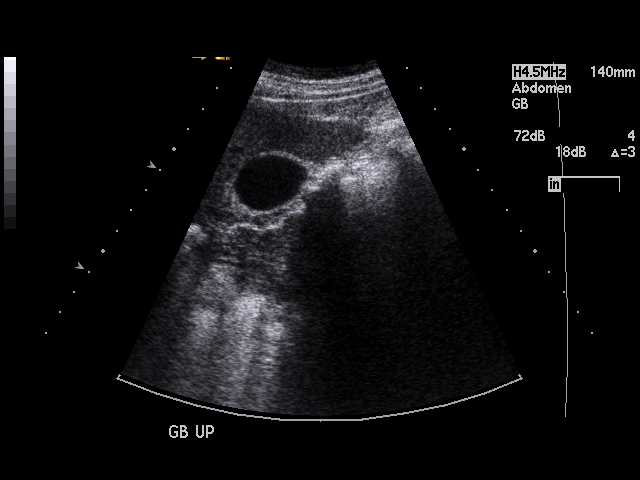

[17 of 25 positions shown; findings below may reference images not displayed]

PROCEDURE:     BOXTA - BOXTA ABDOMEN UPPER GENERAL  - July 26, 2010 [DATE]

RESULT:     The liver, spleen, abdominal aorta and inferior vena cava show
no significant abnormalities. The pancreas is not visualized adequately for
evaluation on this exam. No gallstones are seen. There is no thickening of
the gallbladder wall. The common bile duct measures 4.1 mm in diameter which
is within normal limits. The kidneys show no hydronephrosis. There are
multiple bilateral renal cysts. On the right, the largest cyst is at the
lower pole and measures 3.03 cm. On the left the largest cyst is also at the
lower pole and measures 3.61 cm. There is no hydronephrosis. No ascites is
seen.
IMPRESSION: 1.  No gallstones or other acute changes identified.
2.  Bilateral renal cysts are observed.
3.  The pancreas is not visualized adequately for evaluation on this exam.

## 2012-12-08 ENCOUNTER — Ambulatory Visit: Payer: Self-pay | Admitting: Oncology

## 2012-12-09 ENCOUNTER — Telehealth: Payer: Self-pay | Admitting: *Deleted

## 2012-12-09 NOTE — Telephone Encounter (Signed)
Message copied by Harlon Flor, Les Longmore L on Wed Dec 09, 2012 11:02 AM ------      Message from: Philipp Ovens R      Created: Wed Dec 09, 2012 10:58 AM      Contact: wife       Wants results from her husbands test that she never received from august of last year. [phone] ------

## 2012-12-09 NOTE — Telephone Encounter (Signed)
called and spoke with wife and she stated that the EMG was done at [facility] (03/2012),found ov notes  in centricity from [facility] on that date, informed I would give message to   Doctor to  call with lab and EMG results,Printed results and gave to Dr Marjory Lies.

## 2012-12-17 ENCOUNTER — Ambulatory Visit: Payer: Self-pay | Admitting: Oncology

## 2013-02-22 ENCOUNTER — Other Ambulatory Visit (HOSPITAL_COMMUNITY): Payer: Self-pay | Admitting: Cardiovascular Disease

## 2013-02-22 DIAGNOSIS — I739 Peripheral vascular disease, unspecified: Secondary | ICD-10-CM

## 2013-03-17 ENCOUNTER — Ambulatory Visit (HOSPITAL_COMMUNITY)
Admission: RE | Admit: 2013-03-17 | Discharge: 2013-03-17 | Disposition: A | Discharge status: Home / Self Care | Payer: Medicare Other | Source: Ambulatory Visit | Attending: Cardiovascular Disease | Admitting: Cardiovascular Disease

## 2013-03-17 DIAGNOSIS — I739 Peripheral vascular disease, unspecified: Secondary | ICD-10-CM

## 2013-03-17 NOTE — Progress Notes (Signed)
Arterial Duplex Lower Ext. Completed. Geryl Dohn, RDMS, RVT  

## 2013-03-23 ENCOUNTER — Telehealth: Payer: Self-pay | Admitting: *Deleted

## 2013-03-23 ENCOUNTER — Encounter: Payer: Self-pay | Admitting: *Deleted

## 2013-03-23 DIAGNOSIS — I739 Peripheral vascular disease, unspecified: Secondary | ICD-10-CM

## 2013-03-23 NOTE — Telephone Encounter (Signed)
Message copied by Marella Bile on Tue Mar 23, 2013  8:58 AM ------      Message from: Runell Gess      Created: Fri Mar 19, 2013  9:55 PM       No change from prior study. Repeat in 12 months. ------

## 2013-03-29 ENCOUNTER — Ambulatory Visit: Payer: Self-pay | Admitting: Oncology

## 2013-04-01 ENCOUNTER — Other Ambulatory Visit: Payer: Self-pay | Admitting: Diagnostic Neuroimaging

## 2013-04-19 ENCOUNTER — Ambulatory Visit: Payer: Self-pay | Admitting: Oncology

## 2013-05-05 ENCOUNTER — Emergency Department: Payer: Self-pay | Admitting: Emergency Medicine

## 2013-05-05 LAB — TROPONIN I: Troponin-I: <0.02 ng/mL

## 2013-05-05 LAB — CBC
HGB: 15.3 g/dL (ref 13.0–18.0)
MCHC: 34.8 g/dL (ref 32.0–36.0)
Platelet: 120 10*3/uL — ABNORMAL LOW (ref 150–440)
RBC: 4.92 10*6/uL (ref 4.40–5.90)
RDW: 13.9 % (ref 11.5–14.5)
WBC: 5.5 10*3/uL (ref 3.8–10.6)

## 2013-05-05 LAB — BASIC METABOLIC PANEL
Anion Gap: 3 — ABNORMAL LOW (ref 7–16)
Chloride: 112 mmol/L — ABNORMAL HIGH (ref 98–107)
Creatinine: 1.18 mg/dL (ref 0.60–1.30)
EGFR (African American): >60
EGFR (Non-African Amer.): 59 — ABNORMAL LOW
Glucose: 106 mg/dL — ABNORMAL HIGH (ref 65–99)
Osmolality: 286 (ref 275–301)
Potassium: 3.8 mmol/L (ref 3.5–5.1)
Sodium: 143 mmol/L (ref 136–145)

## 2013-06-02 ENCOUNTER — Other Ambulatory Visit: Payer: Self-pay | Admitting: Diagnostic Neuroimaging

## 2013-06-14 ENCOUNTER — Ambulatory Visit: Payer: Self-pay | Admitting: Oncology

## 2013-06-14 LAB — BASIC METABOLIC PANEL
Anion Gap: 4 — ABNORMAL LOW (ref 7–16)
Chloride: 105 mmol/L (ref 98–107)
Creatinine: 1.19 mg/dL (ref 0.60–1.30)
EGFR (African American): >60
Potassium: 4 mmol/L (ref 3.5–5.1)
Sodium: 141 mmol/L (ref 136–145)

## 2013-06-14 LAB — CBC CANCER CENTER
Basophil %: 1.4 %
Eosinophil #: 0.1 x10 3/mm (ref 0.0–0.7)
Eosinophil %: 2.7 %
HGB: 16.8 g/dL (ref 13.0–18.0)
MCHC: 34.2 g/dL (ref 32.0–36.0)
Monocyte %: 6.8 %
Neutrophil #: 3.2 x10 3/mm (ref 1.4–6.5)
Platelet: 125 x10 3/mm — ABNORMAL LOW (ref 150–440)
WBC: 5.2 x10 3/mm (ref 3.8–10.6)

## 2013-06-15 LAB — CANCER ANTIGEN 27.29: CA 27.29: 39.5 U/mL — ABNORMAL HIGH (ref 0.0–38.6)

## 2013-06-15 LAB — PROT IMMUNOELECTROPHORES(ARMC)

## 2013-06-19 ENCOUNTER — Ambulatory Visit: Payer: Self-pay | Admitting: Oncology

## 2013-07-23 ENCOUNTER — Other Ambulatory Visit: Payer: Self-pay

## 2013-07-23 MED ORDER — DONEPEZIL HCL 10 MG PO TABS: 10.0000 mg | ORAL_TABLET | Freq: Every day | ORAL | Status: DC

## 2013-08-17 ENCOUNTER — Other Ambulatory Visit: Payer: Self-pay | Admitting: Cardiovascular Disease

## 2013-08-18 NOTE — Telephone Encounter (Signed)
Rx was sent to pharmacy electronically. 

## 2013-09-17 IMAGING — CT CT CHEST W/ CM
1 series · 15 of 33 positions shown, 19 images · non-contrast
Comparison: none

REASON FOR EXAM: ATTN RTaxilla  papable tender node in RT axilla   prev
US done
COMMENTS:

[Series 2: soft tissue · axial · 0.84mm/px · z∈[+662,+992]mm · 15 of 78 slices shown, 19 images]
[im 6/78  mediastinal]
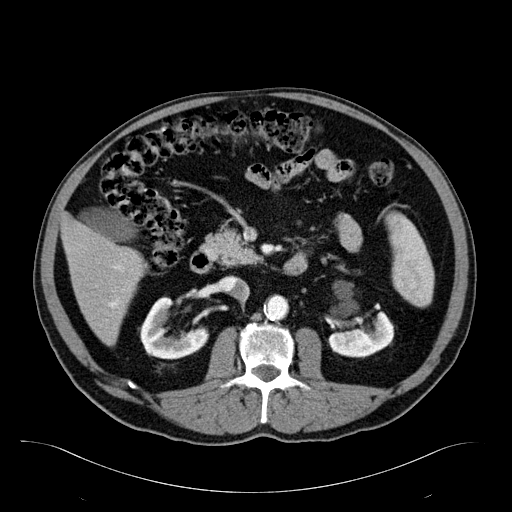
[im 6/78  lung]
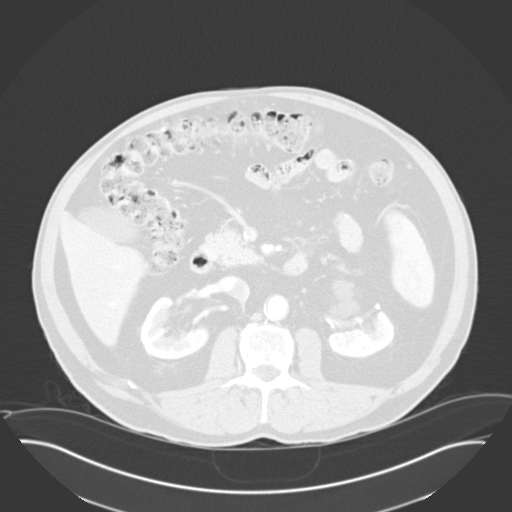
[im 12/78  lung]
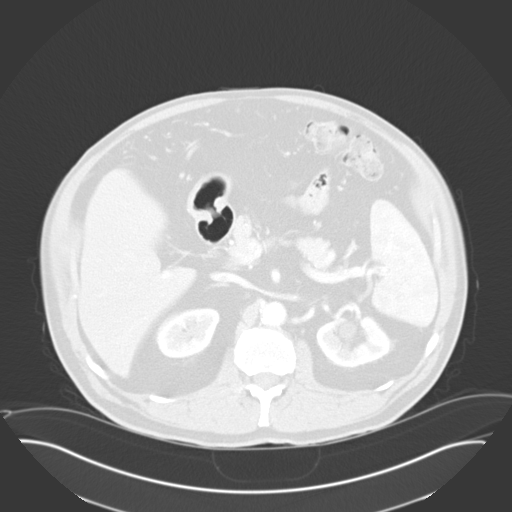
[im 16/78  lung]
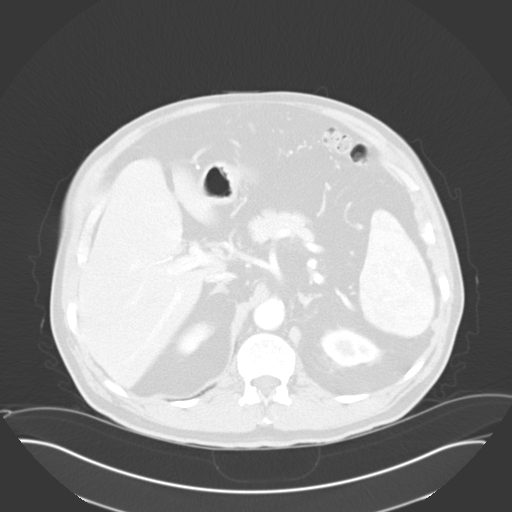
[im 20/78  lung]
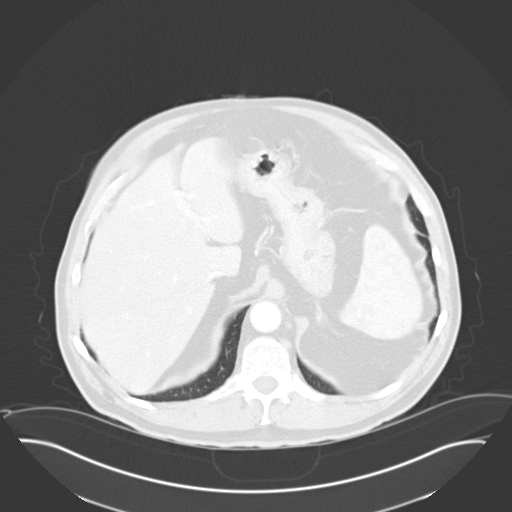
[im 26/78  mediastinal]
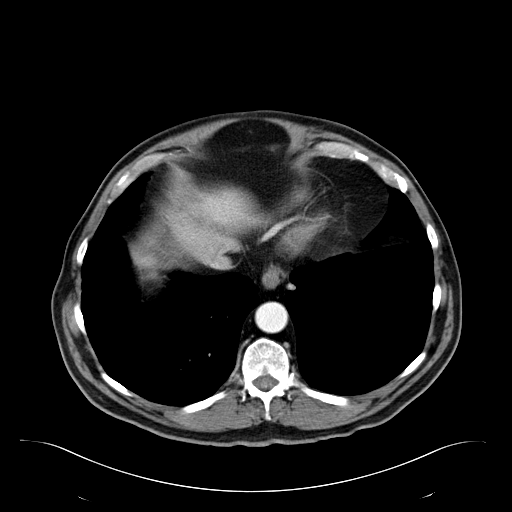
[im 26/78  lung]
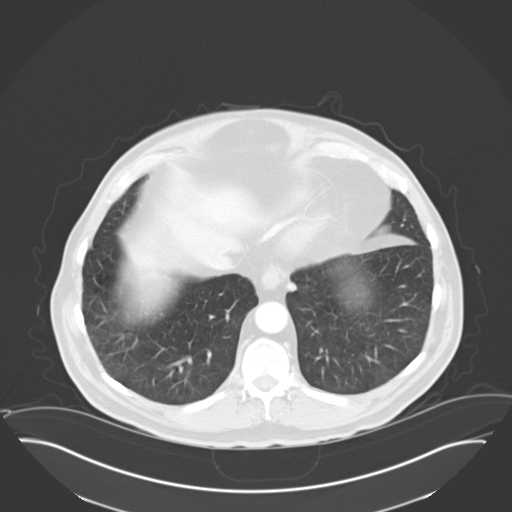
[im 31/78  lung]
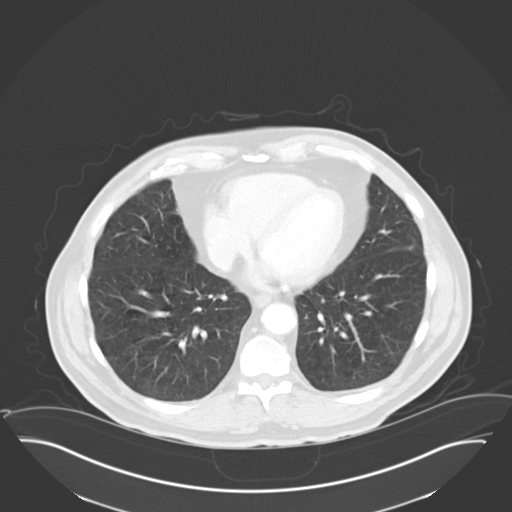
[im 35/78  lung]
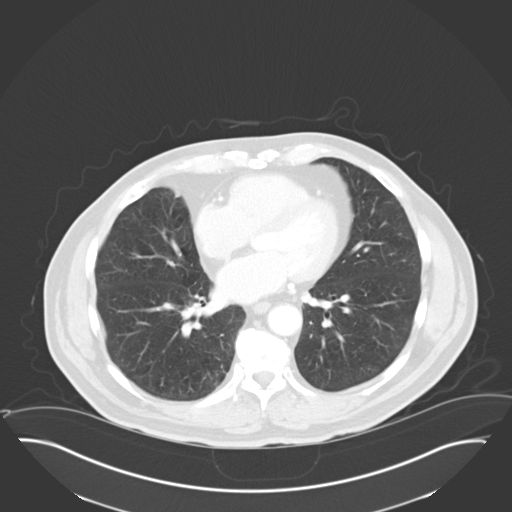
[im 40/78  lung]
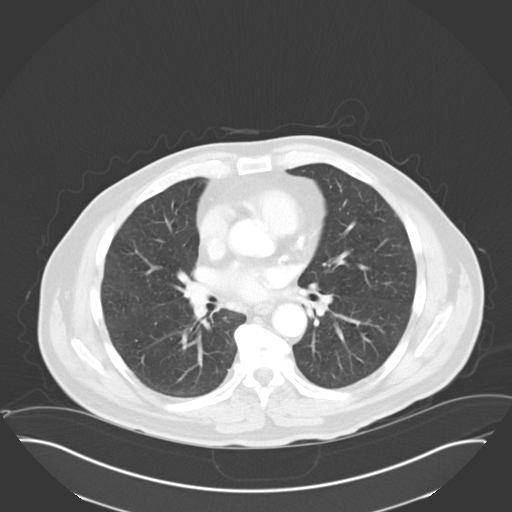
[im 43/78  mediastinal]
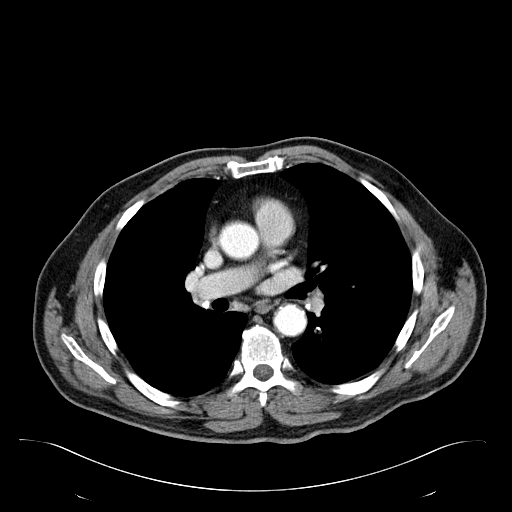
[im 43/78  lung]
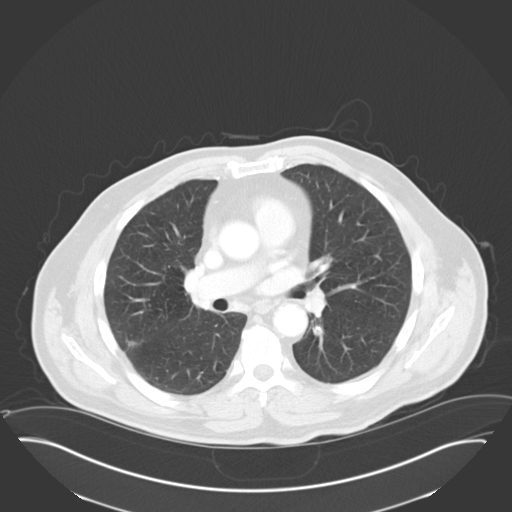
[im 47/78  lung]
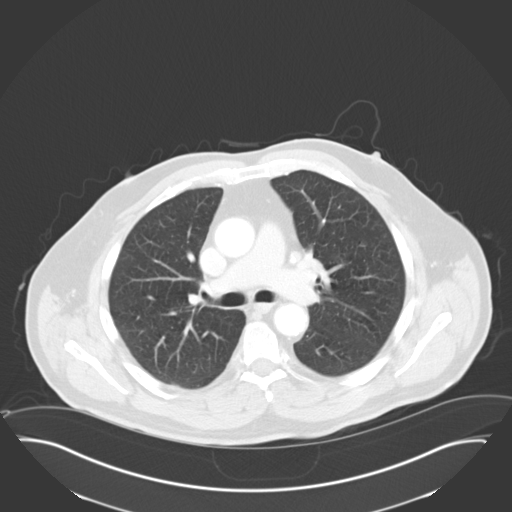
[im 52/78  lung]
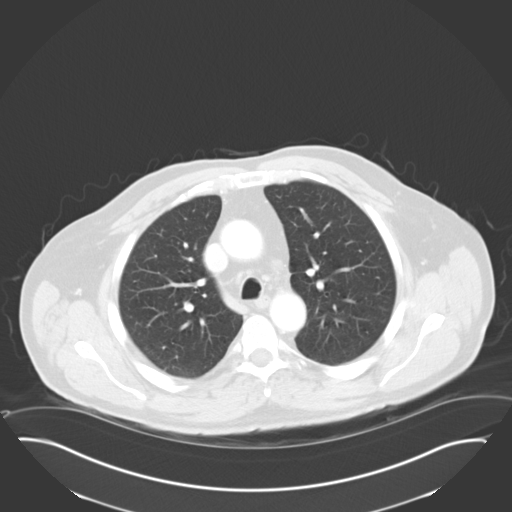
[im 58/78  lung]
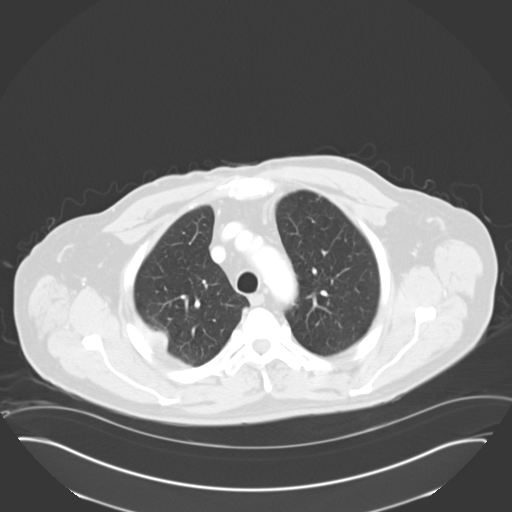
[im 62/78  mediastinal]
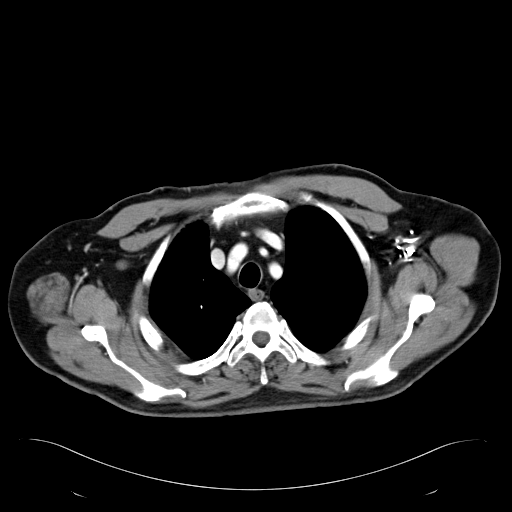
[im 62/78  lung]
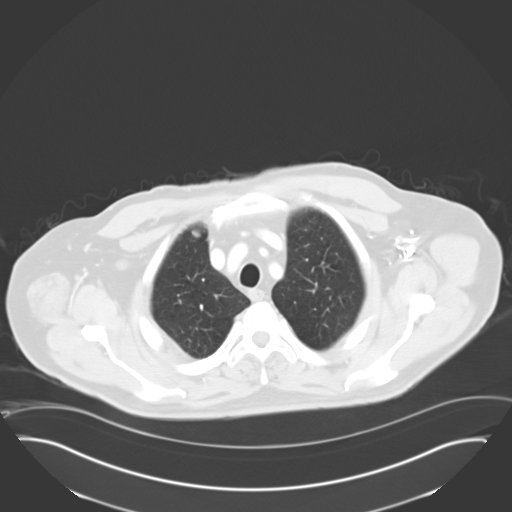
[im 66/78  lung]
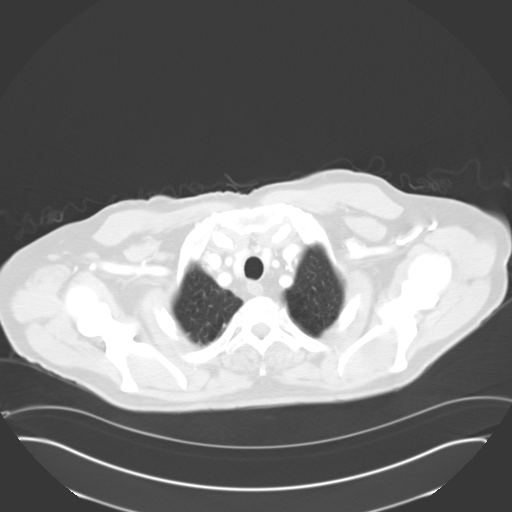
[im 72/78  lung]
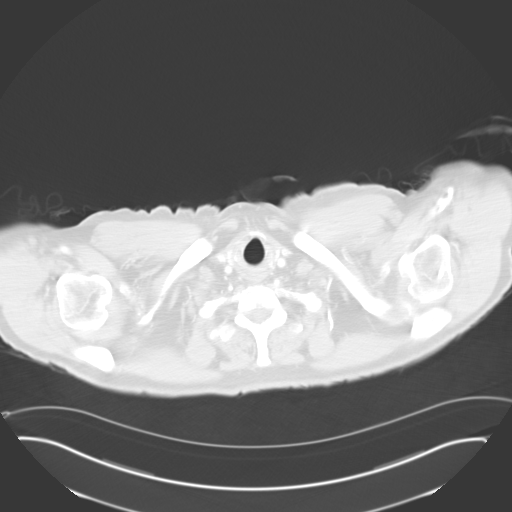

[15 of 33 positions shown; findings below may reference images not displayed]

PROCEDURE:     CT  - CT CHEST WITH CONTRAST  - May 23, 2011  [DATE]

RESULT:     CT of the chest is performed with 60 mL of Usovue-UEL iodinated
intravenous contrast. Images are reconstructed in the axial plane at 5.0 mm
slice thickness. There is no previous exam for comparison.

Images demonstrate a chest wall mass posterolaterally on the right between
the third and fourth ribs posterolaterally with extension into the
intercostal space. This measures as much as 2.63 cm medial to lateral on
image #20 and 2.27 cm anterior to posterior on image #19. No right axillary
mass or adenopathy is appreciated. No mediastinal or hilar mass or
adenopathy is evident. The chest wall is otherwise unremarkable. No pleural
or pericardial effusion is demonstrated. Diffuse emphysematous changes are
noted at lung window settings. There is a small nodular density on image #53
posterolateral to the esophagus measuring 7.4 x 6.1 mm.

The adrenal glands appear to be unremarkable. There is either an extrarenal
pelvis or parapelvic cysts on the left. Atherosclerotic calcification is
noted in the aorta.
IMPRESSION: 1. Chest wall nodule on the right posterolaterally in the upper lobe region
as described. Further investigation with PET/CT is recommended to evaluate
malignant disease.
2. Minimal nodular density in the paramediastinal left lung base.
3. Parapelvic cysts or prominent extrarenal pelvis possibly with a
duplicated collecting system in the left kidney. This area is incompletely
evaluated.

## 2013-10-05 ENCOUNTER — Other Ambulatory Visit: Payer: Self-pay

## 2013-10-05 MED ORDER — DONEPEZIL HCL 10 MG PO TABS: 10.0000 mg | ORAL_TABLET | Freq: Every day | ORAL | Status: DC

## 2013-11-03 ENCOUNTER — Encounter: Payer: Self-pay | Admitting: Cardiovascular Disease

## 2013-11-03 ENCOUNTER — Ambulatory Visit (INDEPENDENT_AMBULATORY_CARE_PROVIDER_SITE_OTHER): Payer: Medicare HMO | Admitting: Cardiovascular Disease

## 2013-11-03 VITALS — BP 142/82 | HR 61 | Ht 67.5 in | Wt 152.0 lb

## 2013-11-03 DIAGNOSIS — E785 Hyperlipidemia, unspecified: Secondary | ICD-10-CM | POA: Insufficient documentation

## 2013-11-03 DIAGNOSIS — I70219 Atherosclerosis of native arteries of extremities with intermittent claudication, unspecified extremity: Secondary | ICD-10-CM

## 2013-11-03 DIAGNOSIS — I739 Peripheral vascular disease, unspecified: Secondary | ICD-10-CM | POA: Insufficient documentation

## 2013-11-03 NOTE — Assessment & Plan Note (Signed)
On statin therapy followed by his PCP 

## 2013-11-03 NOTE — Assessment & Plan Note (Signed)
Status post right-to-left fem-fem crossover grafting performed by Dr. Annamarie Major on  02/15/11 at my request because of an occluded left common iliac artery. He improved quickly after that and denies claudication. His most recent lower extremity arterial Doppler studies performed in our office 03/17/13 revealed ABIs of 1 bilaterally with patent fem-fem crossover graft.

## 2013-11-03 NOTE — Progress Notes (Signed)
11/03/2013 George Garrison   August 30, 1932  [id number]  Primary Physician Ronald Lobo, MD Primary Cardiologist: Lorretta Harp MD Renae Gloss   HPI:  The patient is a very pleasant 80 year old, thin-appearing, married Caucasian male, father of 2 living children (1 deceased,) grandfather to 3 grandchildren who I saw a year ago. He is accompanied by his wife today. He was initially referred to me for claudication. His risk factors include remote tobacco abuse and hyperlipidemia. He also has a peripheral neuropathy followed by Dr. Leta Baptist at [facility] without working diagnosis, though apparently he has been referred to [facility] for further testing. Lower extremity Dopplers in our office revealed an occluded left iliac which was confirmed by angiography which I performed April 26, 2010. I reconstituted the left common femoral artery level by collaterals with 3-vessel runoff. He did have a negative Myoview performed April 09, 2010. At my request, Dr. Annamarie Major performed fem-fem crossover grafting on him February 15, 2011, with improvement in his claudication and his Dopplers. He had some edema post procedure which improved with low dose diuretics and compression stockings.since I saw him back 05/04/12 his remained clinically stable. He denies chest pain, shortness of breath or claudication.  Current Outpatient Prescriptions  Medication Sig Dispense Refill  . aspirin 81 MG tablet Take 81 mg by mouth daily.        Marland Kitchen donepezil (ARICEPT) 10 MG tablet Take 1 tablet (10 mg total) by mouth at bedtime.  90 tablet  0  . OVER THE COUNTER MEDICATION Neuropathy support formula with B1 and B12 vitamins and Alpha Lipoic Acid 2 tabs twice a day      . pravastatin (PRAVACHOL) 40 MG tablet TAKE 1 TABLET BY MOUTH AT BEDTIME  30 tablet  0  . vitamin B-12 (CYANOCOBALAMIN) 1000 MCG tablet Take 1,000 mcg by mouth daily.       No current facility-administered medications for this  visit.    No Known Allergies  History   Social History  . Marital Status: Married    Spouse Name: N/A    Number of Children: N/A  . Years of Education: N/A   Occupational History  . Not on file.   Social History Main Topics  . Smoking status: Former Smoker    Quit date: 03/07/2006  . Smokeless tobacco: Not on file  . Alcohol Use: No  . Drug Use: No  . Sexual Activity: Not on file   Other Topics Concern  . Not on file   Social History Narrative  . No narrative on file     Review of Systems: General: negative for chills, fever, night sweats or weight changes.  Cardiovascular: negative for chest pain, dyspnea on exertion, edema, orthopnea, palpitations, paroxysmal nocturnal dyspnea or shortness of breath Dermatological: negative for rash Respiratory: negative for cough or wheezing Urologic: negative for hematuria Abdominal: negative for nausea, vomiting, diarrhea, bright red blood per rectum, melena, or hematemesis Neurologic: negative for visual changes, syncope, or dizziness All other systems reviewed and are otherwise negative except as noted above.    Blood pressure 142/82, pulse 61, height 5' 7.5" (1.715 m), weight 152 lb (68.947 kg).  General appearance: alert and no distress Neck: no adenopathy, no carotid bruit, no JVD, supple, symmetrical, trachea midline and thyroid not enlarged, symmetric, no tenderness/mass/nodules Lungs: clear to auscultation bilaterally Heart: regular rate and rhythm, S1, S2 normal, no murmur, click, rub or gallop Extremities: extremities normal, atraumatic, no cyanosis or edema and 2+ pedal  pulses  EKG normal sinus rhythm at 61 without ST or T wave changes  ASSESSMENT AND PLAN:   Atherosclerosis of native arteries of the extremities with intermittent claudication Status post right-to-left fem-fem crossover grafting performed by Dr. Annamarie Major on  02/15/11 at my request because of an occluded left common iliac artery. He improved  quickly after that and denies claudication. His most recent lower extremity arterial Doppler studies performed in our office 03/17/13 revealed ABIs of 1 bilaterally with patent fem-fem crossover graft.  Hyperlipidemia On statin therapy followed by his PCP      Lorretta Harp MD [facility], [facility] 11/03/2013 11:28 AM

## 2013-11-03 NOTE — Patient Instructions (Signed)
Your physician wants you to follow-up in: 1 year with Dr Berry. You will receive a reminder letter in the mail two months in advance. If you don't receive a letter, please call our office to schedule the follow-up appointment.  

## 2013-11-05 ENCOUNTER — Emergency Department: Payer: Self-pay | Admitting: Emergency Medicine

## 2013-11-05 LAB — DRUG SCREEN, URINE
Amphetamines, Ur Screen: NEGATIVE (ref ?–1000)
BENZODIAZEPINE, UR SCRN: NEGATIVE (ref ?–200)
Barbiturates, Ur Screen: NEGATIVE (ref ?–200)
CANNABINOID 50 NG, UR ~~LOC~~: NEGATIVE (ref ?–50)
COCAINE METABOLITE, UR ~~LOC~~: NEGATIVE (ref ?–300)
MDMA (Ecstasy)Ur Screen: NEGATIVE (ref ?–500)
METHADONE, UR SCREEN: NEGATIVE (ref ?–300)
Opiate, Ur Screen: NEGATIVE (ref ?–300)
PHENCYCLIDINE (PCP) UR S: NEGATIVE (ref ?–25)
TRICYCLIC, UR SCREEN: NEGATIVE (ref ?–1000)

## 2013-11-05 LAB — COMPREHENSIVE METABOLIC PANEL
ALBUMIN: 3.7 g/dL (ref 3.4–5.0)
AST: 29 U/L (ref 15–37)
Alkaline Phosphatase: 73 U/L
Anion Gap: 2 — ABNORMAL LOW (ref 7–16)
BUN: 13 mg/dL (ref 7–18)
Bilirubin,Total: 0.6 mg/dL (ref 0.2–1.0)
CALCIUM: 9.2 mg/dL (ref 8.5–10.1)
CREATININE: 1.2 mg/dL (ref 0.60–1.30)
Chloride: 109 mmol/L — ABNORMAL HIGH (ref 98–107)
Co2: 29 mmol/L (ref 21–32)
EGFR (African American): >60
GFR CALC NON AF AMER: 57 — AB
Glucose: 93 mg/dL (ref 65–99)
Osmolality: 279 (ref 275–301)
Potassium: 3.9 mmol/L (ref 3.5–5.1)
SGPT (ALT): 27 U/L (ref 12–78)
SODIUM: 140 mmol/L (ref 136–145)
TOTAL PROTEIN: 7.2 g/dL (ref 6.4–8.2)

## 2013-11-05 LAB — URINALYSIS, COMPLETE
BLOOD: NEGATIVE
Bacteria: NONE SEEN
Bilirubin,UR: NEGATIVE
GLUCOSE, UR: NEGATIVE mg/dL (ref 0–75)
KETONE: NEGATIVE
Leukocyte Esterase: NEGATIVE
Nitrite: NEGATIVE
PH: 5 (ref 4.5–8.0)
PROTEIN: NEGATIVE
Specific Gravity: 1.021 (ref 1.003–1.030)
WBC UR: 1 /HPF (ref 0–5)

## 2013-11-05 LAB — CBC
HCT: 49 % (ref 40.0–52.0)
HGB: 16.5 g/dL (ref 13.0–18.0)
MCH: 30.6 pg (ref 26.0–34.0)
MCHC: 33.6 g/dL (ref 32.0–36.0)
MCV: 91 fL (ref 80–100)
Platelet: 133 10*3/uL — ABNORMAL LOW (ref 150–440)
RBC: 5.38 10*6/uL (ref 4.40–5.90)
RDW: 13.9 % (ref 11.5–14.5)
WBC: 7.1 10*3/uL (ref 3.8–10.6)

## 2013-11-05 LAB — ETHANOL: Ethanol: <3 mg/dL

## 2013-11-05 LAB — TROPONIN I: Troponin-I: <0.02 ng/mL

## 2013-11-10 ENCOUNTER — Ambulatory Visit: Payer: Self-pay | Admitting: Diagnostic Neuroimaging

## 2013-11-16 ENCOUNTER — Other Ambulatory Visit: Payer: Self-pay | Admitting: *Deleted

## 2013-11-16 MED ORDER — PRAVASTATIN SODIUM 40 MG PO TABS: 40.0000 mg | ORAL_TABLET | Freq: Every day | ORAL | Status: AC

## 2013-11-16 NOTE — Telephone Encounter (Signed)
Rx refill sent to patients pharmacy  

## 2013-12-11 ENCOUNTER — Emergency Department: Payer: Self-pay | Admitting: Emergency Medicine

## 2013-12-11 LAB — BASIC METABOLIC PANEL
ANION GAP: 7 (ref 7–16)
BUN: 11 mg/dL (ref 7–18)
CALCIUM: 9.3 mg/dL (ref 8.5–10.1)
CO2: 29 mmol/L (ref 21–32)
Chloride: 105 mmol/L (ref 98–107)
Creatinine: 1.11 mg/dL (ref 0.60–1.30)
EGFR (Non-African Amer.): >60
Glucose: 78 mg/dL (ref 65–99)
Osmolality: 280 (ref 275–301)
Potassium: 3.9 mmol/L (ref 3.5–5.1)
SODIUM: 141 mmol/L (ref 136–145)

## 2013-12-11 LAB — CBC
HCT: 44.2 % (ref 40.0–52.0)
HGB: 14.4 g/dL (ref 13.0–18.0)
MCH: 29.8 pg (ref 26.0–34.0)
MCHC: 32.7 g/dL (ref 32.0–36.0)
MCV: 91 fL (ref 80–100)
Platelet: 148 10*3/uL — ABNORMAL LOW (ref 150–440)
RBC: 4.84 10*6/uL (ref 4.40–5.90)
RDW: 14.5 % (ref 11.5–14.5)
WBC: 7.3 10*3/uL (ref 3.8–10.6)

## 2013-12-11 LAB — TROPONIN I: Troponin-I: <0.02 ng/mL

## 2013-12-11 LAB — PROTIME-INR
INR: 1
PROTHROMBIN TIME: 13 s (ref 11.5–14.7)

## 2013-12-11 LAB — APTT: ACTIVATED PTT: 28.6 s (ref 23.6–35.9)

## 2013-12-23 ENCOUNTER — Ambulatory Visit: Payer: Self-pay | Admitting: Diagnostic Neuroimaging

## 2014-01-27 ENCOUNTER — Other Ambulatory Visit: Payer: Self-pay | Admitting: Diagnostic Neuroimaging

## 2014-03-24 ENCOUNTER — Ambulatory Visit (INDEPENDENT_AMBULATORY_CARE_PROVIDER_SITE_OTHER): Payer: Medicare HMO | Admitting: Diagnostic Neuroimaging

## 2014-03-24 ENCOUNTER — Encounter: Payer: Self-pay | Admitting: Diagnostic Neuroimaging

## 2014-03-24 VITALS — BP 118/74 | HR 87 | Temp 97.8°F | Ht 67.0 in | Wt 152.2 lb

## 2014-03-24 DIAGNOSIS — C7931 Secondary malignant neoplasm of brain: Secondary | ICD-10-CM | POA: Insufficient documentation

## 2014-03-24 DIAGNOSIS — C3491 Malignant neoplasm of unspecified part of right bronchus or lung: Secondary | ICD-10-CM

## 2014-03-24 DIAGNOSIS — D472 Monoclonal gammopathy: Secondary | ICD-10-CM

## 2014-03-24 DIAGNOSIS — C3490 Malignant neoplasm of unspecified part of unspecified bronchus or lung: Secondary | ICD-10-CM | POA: Insufficient documentation

## 2014-03-24 DIAGNOSIS — C801 Malignant (primary) neoplasm, unspecified: Secondary | ICD-10-CM

## 2014-03-24 DIAGNOSIS — G609 Hereditary and idiopathic neuropathy, unspecified: Secondary | ICD-10-CM

## 2014-03-24 DIAGNOSIS — I2782 Chronic pulmonary embolism: Secondary | ICD-10-CM | POA: Insufficient documentation

## 2014-03-24 DIAGNOSIS — C7949 Secondary malignant neoplasm of other parts of nervous system: Secondary | ICD-10-CM

## 2014-03-24 NOTE — Progress Notes (Signed)
[facility]  PATIENT: George Garrison DOB: 10/05/1932  REFERRING CLINICIAN:  HISTORY FROM: patient and wife  REASON FOR VISIT: follow up   HISTORICAL  CHIEF COMPLAINT:  Chief Complaint  Patient presents with  . Follow-up    neuropathy, memory loss    HISTORY OF PRESENT ILLNESS:   UPDATE 03/25/14: Since last visit, went to [facility] (Dr. Justin Mend, Aug 2013) and confirmed to have severe chronic neuropathy (? MGUS vs paraneoplastic). Patient was offered gabapentin, PT and AFO, but patient declined these therapies. Then in March 2015, was found to have left frontal brain metastatic lesion, transferred to [facility], s/p craniotomy. Then in April 2015, found to have multiple small PEs, couldn't afford lovenox, then transitioned to coumadin. Last oncology visit, patient and oncology team decided to transition to palliative care rather than ongoing chemo for recurrent chest wall tumor.   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 carcinoma) and treated with radiation and chemo. Had MRI brain last week to stage for mets, and felt that ear plugs or machine not working, and now with severe headache and ear pain. Neuropathy and leg symptoms still present. Sometimes "on fire" and today "frozen" feeling. Still with bilateral foot drop. Uses a cane.  UPDATE 03/13/11: S/p fem bypass surgery, and doing better.  Pain is better.  Still with bilateral foot drop (right worse than left).  LP done and was normal.  UPDATE 02/11/11: Now with increasing falls. Balance is off. Planning to have fem bypass surgery this friday.  UPDATE 08/06/10: still the same; memory and neuropathy sxs unchanged.  vasc sx is on hold b/c of unexplained right chest pain.  PRIOR HPI: 80 year old male with hypercholesterolemia and peripheral vascular disease presented for evaluation of balance difficulty, suspected neuropathy and memory difficulty. He is accompanied by his wife.  Presently one year ago patient noted a "tightness"in his feet. He also has been "slapping the floor" when he walks. He is having difficulty walking and balance. He is worried when he takes a shower that he may fall down. Over the past 2 years he has been having short-term memory difficulty. Especially when he is doing past it projects around the house. Frequently when he walks from one room to another or from his house to his barn he forgets the reason why he went there. His also been having trouble with driving. He is now having episodes when he gets lost even when driving in familiar areas.  REVIEW OF SYSTEMS: Full 14 system review of systems performed and notable only for blurred vision walking diff freq urination numbness easy brusing memory loss numbness.  ALLERGIES: No Known Allergies  HOME MEDICATIONS: Outpatient Prescriptions Prior to Visit  Medication Sig Dispense Refill  . OVER THE COUNTER MEDICATION Neuropathy support formula with B1 and B12 vitamins and Alpha Lipoic Acid 2 tabs twice a day      . pravastatin (PRAVACHOL) 40 MG tablet Take 1 tablet (40 mg total) by mouth daily.  30 tablet  10  . donepezil (ARICEPT) 10 MG tablet TAKE 1 TABLET (10 MG TOTAL) BY MOUTH AT BEDTIME.  30 tablet  0  . vitamin B-12 (CYANOCOBALAMIN) 1000 MCG tablet Take 1,000 mcg by mouth daily.      Marland Kitchen aspirin 81 MG tablet Take 81 mg by mouth daily.         No facility-administered medications prior to visit.    PAST MEDICAL HISTORY: Past Medical History  Diagnosis  Date  . Peripheral vascular disease   . Hyperlipidemia   . Cataracts, bilateral     PAST SURGICAL HISTORY: Past Surgical History  Procedure Laterality Date  . Femoral to femoral bypass graft  02/15/11  . Inguinal hernia repair  01/2011    right    FAMILY HISTORY: Family History  Problem Relation Age of Onset  . Heart disease Mother   . Heart disease Sister   . Heart disease Brother     SOCIAL HISTORY:  History   Social History   . Marital Status: Married    Spouse Name: Vaughan Basta    Number of Children: 2  . Years of Education: 9th   Occupational History  .  Other   Social History Main Topics  . Smoking status: Former Smoker    Quit date: 03/07/2006  . Smokeless tobacco: Never Used  . Alcohol Use: No  . Drug Use: No  . Sexual Activity: Not on file   Other Topics Concern  . Not on file   Social History Narrative   Patient lives at home with his spouse.   Caffeine Use: 2-3 cups daily     PHYSICAL EXAM  Filed Vitals:   03/24/14 1430  BP: 118/74  Pulse: 87  Temp: 97.8 F (36.6 C)  TempSrc: Oral  Height: 5\' 7"  (1.702 m)  Weight: 152 lb 3.2 oz (69.037 kg)    Not recorded    Body mass index is 23.83 kg/(m^2).  Physical Exam  General: Patient is awake, alert and in no acute distress.  Well developed and groomed. Neck: Neck is supple. Cardiovascular: Heart is regular rate and rhythm with no murmurs.  Neurologic Exam  Mental Status: Awake, alert.  Language is fluent and comprehension intact.  MMSE 20/30. GDS 1. Cranial Nerves: Pupils are equal and reactive to light.  Visual fields are full to confrontation.  Conjugate eye movements are full and symmetric.  Facial sensation and strength are symmetric.  Hearing is intact.  Palate elevated symmetrically and uvula is midline.  Shoulder shug is symmetric.  Tongue is midline. Motor: Normal bulk and tone.  Full strength in the upper extremities and proximal lower ext. RIGHT DF 3/5, LEFT DF 3/5.  UNABLE TO WALK ON TOES OR HEELS.  No pronator drift. Sensory: ABSENT VIB AT TOES.  DECREASED PP IN FEET WITH GRADIENT. Coordination: ACTION TREMOR IN BUE. Gait and Station: UNSTEADY GAIT; CAUTIOUS. CANNOT TANDEM, TOE OR HEEL GAIT. USES CANE. SLAPPING GAIT. Reflexes: Deep tendon reflexes in the UPPER EXT ARE TRACE; ABSENT AT KNEES AND ANKLES.    DIAGNOSTIC DATA (LABS, IMAGING, TESTING) - I reviewed patient records, labs, notes, testing and imaging myself where  available.  Lab Results  Component Value Date   WBC 5.4 04/09/2011   HGB 14.7 04/09/2011   HCT 42.9 04/09/2011   MCV 88.3 04/09/2011   PLT 134* 04/09/2011      Component Value Date/Time   NA 143 04/09/2011 1311   K 4.3 04/09/2011 1311   CL 106 04/09/2011 1311   CO2 19 04/09/2011 1311   GLUCOSE 86 04/09/2011 1311   BUN 12 04/09/2011 1311   CREATININE 1.02 04/09/2011 1311   CALCIUM 9.5 04/09/2011 1311   PROT 6.6 04/09/2011 1311   ALBUMIN 4.2 04/09/2011 1311   AST 24 04/09/2011 1311   ALT 21 04/09/2011 1311   ALKPHOS 52 04/09/2011 1311   BILITOT 0.8 04/09/2011 1311   GFRNONAA >60 02/17/2011 0545   GFRAA >60 02/17/2011 0545   No  results found for this basename: CHOL, HDL, LDLCALC, LDLDIRECT, TRIG, CHOLHDL   No results found for this basename: HGBA1C   No results found for this basename: VITAMINB12   No results found for this basename: TSH      ASSESSMENT AND PLAN  80 y.o. year old male here with peripheral vascular disease, hypercholesterolemia, idiopathic neuropathy, mild dementia, metastatic NSCLC, s/p resection, radiation, chemotherapy.  PROBLEM 1. NEUROPATHY (Severe, length-dependent, axonal sensorimotor polyneuropathy) Extensive testing, EMG, LP, labs, and second opinion at [facility] (Dr. Venetia Maxon) unremarkable except for IgG kappa MGUS and MRI lumbar spine showed L3-4 and L4-5 biforaminal stenosis (mild) and bilateral renal cysts. Offered gabapentin for pain control, but he declined. No further mgmt of neuropathy advised.   PROBLEM 2. MEMORY LOSS Regarding his memory difficulty he scored 23/30 on MMSE on 06/05/10, 21/30 on 03/04/12, and 20/30 on 03/24/14. MRI brain (06/11/10) with and without contrast shows mild diffuse and moderate bitemporal atrophy; mild chronic small vessel ischemic disease. Was on donepezil 10mg , now off for past 1 month (ran out). Now with metastatic NSCLC, with left frontal brain lesion. Now moving towards palliative care / hospice. Do not recommend to restart donepezil.    PLAN: 1. Agree with palliative care 2. Can consider gabapentin in future for painful neuropathy treatment, if patient wants this  Return if symptoms worsen or fail to improve.    Penni Bombard, MD 08/21/2447, 7:53 PM Certified in Neurology, Neurophysiology and Neuroimaging  [facility] [address] [place], [place] [address] [phone]

## 2014-03-24 NOTE — Patient Instructions (Signed)
Stop donepezil.  Consider gabapentin for neuropathy pain.

## 2014-03-28 ENCOUNTER — Ambulatory Visit (HOSPITAL_COMMUNITY)
Admission: RE | Admit: 2014-03-28 | Discharge: 2014-03-28 | Disposition: A | Discharge status: Home / Self Care | Payer: Medicare HMO | Source: Ambulatory Visit | Attending: Cardiovascular Disease | Admitting: Cardiovascular Disease

## 2014-03-28 DIAGNOSIS — I739 Peripheral vascular disease, unspecified: Secondary | ICD-10-CM | POA: Diagnosis not present

## 2014-03-28 NOTE — Progress Notes (Signed)
Arterial Duplex Lower Ext. Completed. Mayanna Garlitz, BS, RDMS, RVT  

## 2014-04-01 ENCOUNTER — Ambulatory Visit: Payer: Self-pay | Admitting: Oncology

## 2014-04-01 LAB — PROTIME-INR
INR: 2
PROTHROMBIN TIME: 21.9 s — AB (ref 11.5–14.7)

## 2014-04-05 ENCOUNTER — Telehealth: Payer: Self-pay | Admitting: *Deleted

## 2014-04-05 ENCOUNTER — Encounter: Payer: Self-pay | Admitting: *Deleted

## 2014-04-05 DIAGNOSIS — I739 Peripheral vascular disease, unspecified: Secondary | ICD-10-CM

## 2014-04-05 NOTE — Telephone Encounter (Signed)
Message copied by Chauncy Lean on Tue Apr 05, 2014 11:49 AM ------      Message from: Lorretta Harp      Created: Mon Apr 04, 2014  5:37 PM       No change from prior study. Repeat in 12 months. ------

## 2014-04-05 NOTE — Telephone Encounter (Signed)
Order placed for repeat lower extremity arterial doppler in 1 year

## 2014-04-19 ENCOUNTER — Ambulatory Visit: Payer: Self-pay | Admitting: Oncology

## 2014-05-04 LAB — PROTIME-INR
INR: 2.1
Prothrombin Time: 23.4 secs — ABNORMAL HIGH (ref 11.5–14.7)

## 2014-05-19 ENCOUNTER — Ambulatory Visit: Payer: Self-pay | Admitting: Oncology

## 2014-06-07 ENCOUNTER — Emergency Department: Payer: Self-pay | Admitting: Emergency Medicine

## 2014-06-07 LAB — CBC
HCT: 45.2 % (ref 40.0–52.0)
HGB: 14.7 g/dL (ref 13.0–18.0)
MCH: 29.5 pg (ref 26.0–34.0)
MCHC: 32.6 g/dL (ref 32.0–36.0)
MCV: 91 fL (ref 80–100)
Platelet: 151 10*3/uL (ref 150–440)
RBC: 4.99 10*6/uL (ref 4.40–5.90)
RDW: 14 % (ref 11.5–14.5)
WBC: 9.9 10*3/uL (ref 3.8–10.6)

## 2014-06-07 LAB — URINALYSIS, COMPLETE
BLOOD: NEGATIVE
Bacteria: NONE SEEN
Bilirubin,UR: NEGATIVE
Ketone: NEGATIVE
Leukocyte Esterase: NEGATIVE
Nitrite: NEGATIVE
Ph: 5 (ref 4.5–8.0)
Protein: NEGATIVE
RBC,UR: NONE SEEN /HPF (ref 0–5)
Specific Gravity: 1.029 (ref 1.003–1.030)
Squamous Epithelial: <1
WBC UR: 2 /HPF (ref 0–5)

## 2014-06-07 LAB — COMPREHENSIVE METABOLIC PANEL
ALT: 18 U/L
AST: 20 U/L (ref 15–37)
Albumin: 3.1 g/dL — ABNORMAL LOW (ref 3.4–5.0)
Alkaline Phosphatase: 87 U/L
Anion Gap: 8 (ref 7–16)
BUN: 15 mg/dL (ref 7–18)
Bilirubin,Total: 0.6 mg/dL (ref 0.2–1.0)
CALCIUM: 8.5 mg/dL (ref 8.5–10.1)
CHLORIDE: 111 mmol/L — AB (ref 98–107)
CREATININE: 1.21 mg/dL (ref 0.60–1.30)
Co2: 25 mmol/L (ref 21–32)
EGFR (African American): >60
EGFR (Non-African Amer.): >60
GLUCOSE: 140 mg/dL — AB (ref 65–99)
OSMOLALITY: 290 (ref 275–301)
Potassium: 3.5 mmol/L (ref 3.5–5.1)
SODIUM: 144 mmol/L (ref 136–145)
TOTAL PROTEIN: 6.7 g/dL (ref 6.4–8.2)

## 2014-06-07 LAB — PROTIME-INR
INR: 2.7
PROTHROMBIN TIME: 27.9 s — AB (ref 11.5–14.7)

## 2014-06-07 LAB — TROPONIN I: Troponin-I: <0.02 ng/mL

## 2014-06-07 LAB — CK TOTAL AND CKMB (NOT AT ARMC)
CK, TOTAL: 71 U/L
CK-MB: 1.9 ng/mL (ref 0.5–3.6)

## 2014-06-12 LAB — CULTURE, BLOOD (SINGLE)

## 2014-06-19 ENCOUNTER — Ambulatory Visit: Payer: Self-pay | Admitting: Oncology

## 2014-06-27 ENCOUNTER — Emergency Department: Payer: Self-pay | Admitting: Emergency Medicine

## 2014-06-27 LAB — CBC
HCT: 44.3 % (ref 40.0–52.0)
HGB: 15.1 g/dL (ref 13.0–18.0)
MCH: 31 pg (ref 26.0–34.0)
MCHC: 34 g/dL (ref 32.0–36.0)
MCV: 91 fL (ref 80–100)
Platelet: 96 10*3/uL — ABNORMAL LOW (ref 150–440)
RBC: 4.86 10*6/uL (ref 4.40–5.90)
RDW: 15.7 % — ABNORMAL HIGH (ref 11.5–14.5)
WBC: 11.9 10*3/uL — AB (ref 3.8–10.6)

## 2014-06-27 LAB — APTT: ACTIVATED PTT: 55.2 s — AB (ref 23.6–35.9)

## 2014-06-27 LAB — PROTIME-INR
INR: 8.6
PROTHROMBIN TIME: 67.6 s — AB (ref 11.5–14.7)

## 2014-06-27 LAB — BASIC METABOLIC PANEL
ANION GAP: 8 (ref 7–16)
BUN: 21 mg/dL — AB (ref 7–18)
CO2: 27 mmol/L (ref 21–32)
Calcium, Total: 8.1 mg/dL — ABNORMAL LOW (ref 8.5–10.1)
Chloride: 103 mmol/L (ref 98–107)
Creatinine: 0.87 mg/dL (ref 0.60–1.30)
EGFR (Non-African Amer.): >60
Glucose: 140 mg/dL — ABNORMAL HIGH (ref 65–99)
OSMOLALITY: 281 (ref 275–301)
Potassium: 3.9 mmol/L (ref 3.5–5.1)
Sodium: 138 mmol/L (ref 136–145)

## 2014-06-29 LAB — PROTIME-INR
INR: 1.4
Prothrombin Time: 17.3 secs — ABNORMAL HIGH (ref 11.5–14.7)

## 2014-07-19 ENCOUNTER — Ambulatory Visit: Payer: Self-pay | Admitting: Oncology

## 2014-09-05 ENCOUNTER — Telehealth: Payer: Self-pay | Admitting: Cardiovascular Disease

## 2014-09-05 NOTE — Telephone Encounter (Signed)
George Garrison passed away on Friday .   Thanks

## 2014-09-19 DEATH — deceased

## 2015-08-31 IMAGING — CR DG CHEST 1V PORT
1 series · 1 of 1 positions shown · non-contrast
Comparison: none

REASON FOR EXAM: Chest Pain
COMMENTS:

[ap]
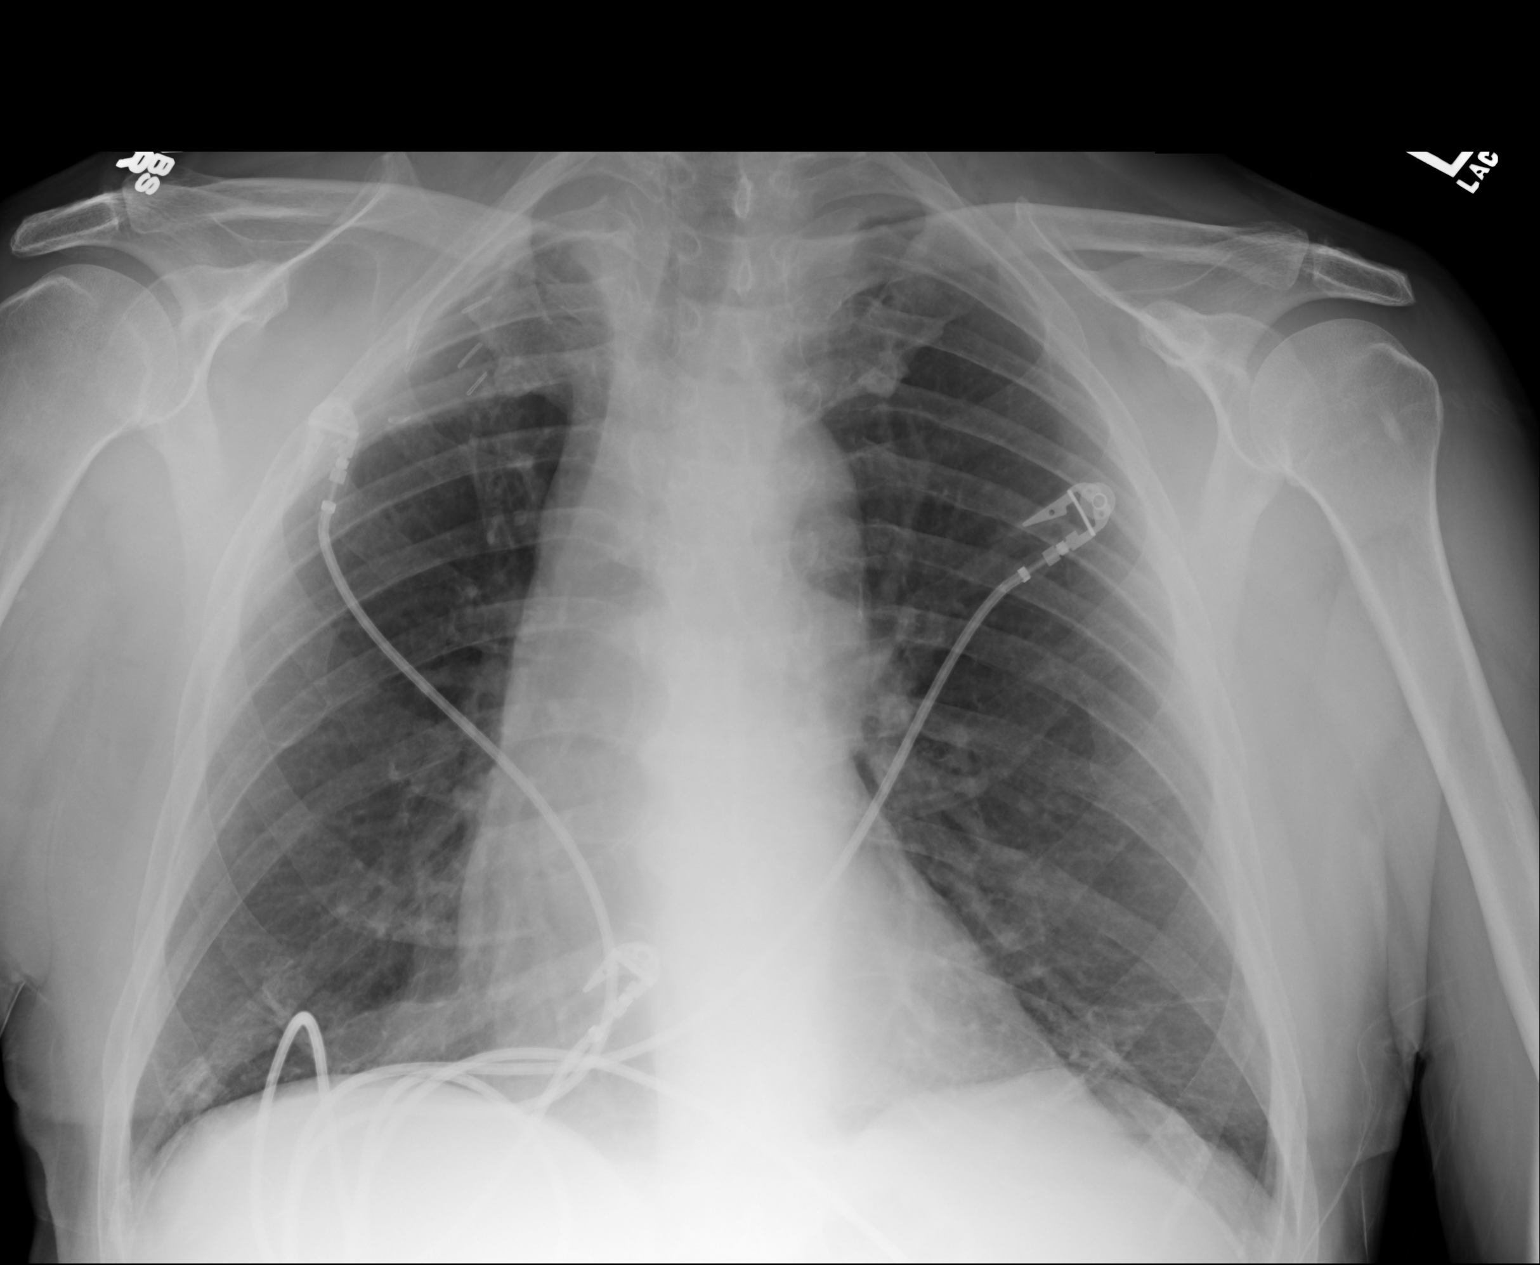

[1 of 1 positions shown; findings below may reference images not displayed]

PROCEDURE:     DXR - DXR PORTABLE CHEST SINGLE VIEW  - May 05, 2013  [DATE]

RESULT:     Comparison is made to the study of 07/04/2011.

Surgical clips project over the upper right lung in the inferoseptal
clavicular region. There is hazy increased density in this area. Followup PA
and lateral views would be helpful when the patient's condition permits.
Monitoring electrodes are present. There is linear fibrosis or atelectasis
at the left lung base. The lungs are otherwise clear. There is mild
hyperinflation. The heart is normal in size. There is no effusion or
pneumothorax.
IMPRESSION: 1. COPD. Hazy increased density at the right lung apex is nonspecific.
Underlying fibrosis, mass or infiltrate are differential considerations with
atelectasis or fibrosis favored. There is some minimal linear atelectasis
versus fibrosis at the left costophrenic angle. Followup PA and lateral
views the chest would be helpful when the patient's condition permits.

[REDACTED]
